# Patient Record
Sex: Male | Born: 1945 | Race: Black or African American | Hispanic: No | Smoking: Former smoker
Health system: Southern US, Community
[De-identification: ages and names within clinical notes are randomized; demographics above are authoritative.]

## PROBLEM LIST (undated history)

## (undated) DIAGNOSIS — R197 Diarrhea, unspecified: Secondary | ICD-10-CM

## (undated) DIAGNOSIS — I10 Essential (primary) hypertension: Secondary | ICD-10-CM

## (undated) DIAGNOSIS — C801 Malignant (primary) neoplasm, unspecified: Secondary | ICD-10-CM

## (undated) DIAGNOSIS — N400 Enlarged prostate without lower urinary tract symptoms: Secondary | ICD-10-CM

## (undated) DIAGNOSIS — Z9889 Other specified postprocedural states: Secondary | ICD-10-CM

## (undated) DIAGNOSIS — K589 Irritable bowel syndrome without diarrhea: Secondary | ICD-10-CM

## (undated) DIAGNOSIS — I341 Nonrheumatic mitral (valve) prolapse: Secondary | ICD-10-CM

## (undated) DIAGNOSIS — R112 Nausea with vomiting, unspecified: Secondary | ICD-10-CM

## (undated) HISTORY — DX: Irritable bowel syndrome, unspecified: K58.9

## (undated) HISTORY — PX: SHOULDER ARTHROSCOPY: SHX128

## (undated) HISTORY — PX: OTHER SURGICAL HISTORY: SHX169

---

## 1997-07-14 ENCOUNTER — Emergency Department (HOSPITAL_COMMUNITY): Admission: EM | Admit: 1997-07-14 | Discharge: 1997-07-14 | Payer: Self-pay | Admitting: Emergency Medicine

## 1997-09-29 ENCOUNTER — Emergency Department (HOSPITAL_COMMUNITY): Admission: EM | Admit: 1997-09-29 | Discharge: 1997-09-29 | Payer: Self-pay | Admitting: Emergency Medicine

## 2001-01-23 ENCOUNTER — Encounter: Payer: Self-pay | Admitting: Emergency Medicine

## 2001-01-23 ENCOUNTER — Encounter: Admission: RE | Admit: 2001-01-23 | Discharge: 2001-01-23 | Payer: Self-pay | Admitting: Emergency Medicine

## 2001-03-17 ENCOUNTER — Ambulatory Visit (HOSPITAL_COMMUNITY): Admission: RE | Admit: 2001-03-17 | Discharge: 2001-03-17 | Payer: Self-pay | Admitting: Gastroenterology

## 2004-06-01 ENCOUNTER — Encounter: Admission: RE | Admit: 2004-06-01 | Discharge: 2004-06-01 | Payer: Self-pay | Admitting: Emergency Medicine

## 2012-02-24 ENCOUNTER — Other Ambulatory Visit: Payer: Self-pay | Admitting: Gastroenterology

## 2012-02-24 DIAGNOSIS — R109 Unspecified abdominal pain: Secondary | ICD-10-CM

## 2012-02-24 DIAGNOSIS — R197 Diarrhea, unspecified: Secondary | ICD-10-CM

## 2012-02-25 ENCOUNTER — Ambulatory Visit
Admission: RE | Admit: 2012-02-25 | Discharge: 2012-02-25 | Disposition: A | Discharge status: Home / Self Care | Payer: 59 | Source: Ambulatory Visit | Attending: Gastroenterology | Admitting: Gastroenterology

## 2012-02-25 DIAGNOSIS — R197 Diarrhea, unspecified: Secondary | ICD-10-CM

## 2012-02-25 DIAGNOSIS — R109 Unspecified abdominal pain: Secondary | ICD-10-CM

## 2012-02-25 MED ORDER — IOHEXOL 300 MG/ML  SOLN
100.0000 mL | Freq: Once | INTRAMUSCULAR | Status: AC | PRN
  Administered 2012-02-25: 100 mL via INTRAVENOUS

## 2013-01-18 ENCOUNTER — Ambulatory Visit (HOSPITAL_COMMUNITY): Payer: Medicare Other | Attending: Cardiology

## 2013-01-18 DIAGNOSIS — Z136 Encounter for screening for cardiovascular disorders: Secondary | ICD-10-CM

## 2013-01-18 DIAGNOSIS — Z0000 Encounter for general adult medical examination without abnormal findings: Secondary | ICD-10-CM

## 2013-01-18 DIAGNOSIS — I10 Essential (primary) hypertension: Secondary | ICD-10-CM | POA: Insufficient documentation

## 2013-01-18 DIAGNOSIS — Z1389 Encounter for screening for other disorder: Secondary | ICD-10-CM | POA: Insufficient documentation

## 2013-01-18 DIAGNOSIS — Z87891 Personal history of nicotine dependence: Secondary | ICD-10-CM | POA: Insufficient documentation

## 2014-07-02 ENCOUNTER — Encounter (HOSPITAL_COMMUNITY): Payer: Self-pay | Admitting: Emergency Medicine

## 2014-07-02 ENCOUNTER — Observation Stay (HOSPITAL_COMMUNITY)
Admission: EM | Admit: 2014-07-02 | Discharge: 2014-07-03 | Disposition: A | Discharge status: Home / Self Care | Payer: Medicare Other | Attending: Family Medicine | Admitting: Family Medicine

## 2014-07-02 ENCOUNTER — Emergency Department (HOSPITAL_COMMUNITY): Payer: Medicare Other

## 2014-07-02 DIAGNOSIS — Z880 Allergy status to penicillin: Secondary | ICD-10-CM | POA: Insufficient documentation

## 2014-07-02 DIAGNOSIS — R079 Chest pain, unspecified: Secondary | ICD-10-CM

## 2014-07-02 DIAGNOSIS — R0789 Other chest pain: Secondary | ICD-10-CM | POA: Diagnosis not present

## 2014-07-02 DIAGNOSIS — I10 Essential (primary) hypertension: Secondary | ICD-10-CM | POA: Diagnosis not present

## 2014-07-02 HISTORY — DX: Nonrheumatic mitral (valve) prolapse: I34.1

## 2014-07-02 HISTORY — DX: Essential (primary) hypertension: I10

## 2014-07-02 LAB — BASIC METABOLIC PANEL
ANION GAP: 4 — AB (ref 5–15)
BUN: 11 mg/dL (ref 6–23)
CHLORIDE: 106 mmol/L (ref 96–112)
CO2: 28 mmol/L (ref 19–32)
Calcium: 9.7 mg/dL (ref 8.4–10.5)
Creatinine, Ser: 1.18 mg/dL (ref 0.50–1.35)
GFR calc Af Amer: 72 mL/min — ABNORMAL LOW (ref >90)
GFR calc non Af Amer: 63 mL/min — ABNORMAL LOW (ref >90)
Glucose, Bld: 86 mg/dL (ref 70–99)
POTASSIUM: 4.9 mmol/L (ref 3.5–5.1)
SODIUM: 138 mmol/L (ref 135–145)

## 2014-07-02 LAB — CBC WITH DIFFERENTIAL/PLATELET
Basophils Absolute: 0.1 10*3/uL (ref 0.0–0.1)
Basophils Relative: 3 % — ABNORMAL HIGH (ref 0–1)
Eosinophils Absolute: 0 10*3/uL (ref 0.0–0.7)
Eosinophils Relative: 1 % (ref 0–5)
HCT: 44.6 % (ref 39.0–52.0)
HEMOGLOBIN: 14.9 g/dL (ref 13.0–17.0)
Lymphocytes Relative: 43 % (ref 12–46)
Lymphs Abs: 1.2 10*3/uL (ref 0.7–4.0)
MCH: 29.9 pg (ref 26.0–34.0)
MCHC: 33.4 g/dL (ref 30.0–36.0)
MCV: 89.6 fL (ref 78.0–100.0)
Monocytes Absolute: 0.3 10*3/uL (ref 0.1–1.0)
Monocytes Relative: 9 % (ref 3–12)
NEUTROS ABS: 1.3 10*3/uL — AB (ref 1.7–7.7)
Neutrophils Relative %: 44 % (ref 43–77)
PLATELETS: 177 10*3/uL (ref 150–400)
RBC: 4.98 MIL/uL (ref 4.22–5.81)
RDW: 14.7 % (ref 11.5–15.5)
WBC: 2.9 10*3/uL — AB (ref 4.0–10.5)

## 2014-07-02 LAB — TROPONIN I: Troponin I: <0.03 ng/mL (ref <0.031)

## 2014-07-02 LAB — I-STAT TROPONIN, ED: TROPONIN I, POC: 0 ng/mL (ref 0.00–0.08)

## 2014-07-02 MED ORDER — ONDANSETRON HCL 4 MG/2ML IJ SOLN: 4.0000 mg | Freq: Four times a day (QID) | INTRAMUSCULAR | Status: DC | PRN

## 2014-07-02 MED ORDER — NITROGLYCERIN 0.4 MG SL SUBL
0.4000 mg | SUBLINGUAL_TABLET | SUBLINGUAL | Status: DC | PRN
  Filled 2014-07-02: qty 1

## 2014-07-02 MED ORDER — ENOXAPARIN SODIUM 40 MG/0.4ML ~~LOC~~ SOLN
40.0000 mg | SUBCUTANEOUS | Status: DC
  Administered 2014-07-02: 40 mg via SUBCUTANEOUS
  Filled 2014-07-02: qty 0.4

## 2014-07-02 MED ORDER — ASPIRIN EC 81 MG PO TBEC
81.0000 mg | DELAYED_RELEASE_TABLET | Freq: Every day | ORAL | Status: DC
  Administered 2014-07-03: 81 mg via ORAL
  Filled 2014-07-02: qty 1

## 2014-07-02 MED ORDER — ALFUZOSIN HCL ER 10 MG PO TB24
10.0000 mg | ORAL_TABLET | Freq: Every day | ORAL | Status: DC
  Administered 2014-07-03: 10 mg via ORAL
  Filled 2014-07-02 (×3): qty 1

## 2014-07-02 MED ORDER — MORPHINE SULFATE 4 MG/ML IJ SOLN: 2.0000 mg | Freq: Once | INTRAMUSCULAR | Status: DC

## 2014-07-02 MED ORDER — HYDRALAZINE HCL 20 MG/ML IJ SOLN: 10.0000 mg | Freq: Four times a day (QID) | INTRAMUSCULAR | Status: DC | PRN

## 2014-07-02 MED ORDER — ACETAMINOPHEN 325 MG PO TABS: 650.0000 mg | ORAL_TABLET | ORAL | Status: DC | PRN

## 2014-07-02 MED ORDER — MORPHINE SULFATE 2 MG/ML IJ SOLN
1.0000 mg | INTRAMUSCULAR | Status: DC | PRN
Start: 2014-07-02 — End: 2014-07-03
  Administered 2014-07-02: 1 mg via INTRAVENOUS
  Filled 2014-07-02: qty 1

## 2014-07-02 NOTE — ED Provider Notes (Signed)
CSN: [id number]     Arrival date & time 07/02/14  1447 History   First MD Initiated Contact with Patient 07/02/14 1501     Chief Complaint  Patient presents with  . Chest Pain     (Consider location/radiation/quality/duration/timing/severity/associated sxs/prior Treatment) HPI Christopher Duran is a 69 y.o. male with hx of htn, prsents to ED with complaint of chest pain and dizziness. Pt states chest feels "like something is squeezing it." States symptoms come and go lasting approximately 15 minutes. They're nonexertional. States he also noted his blood pressure has been elevated the last several days, was high yesterday. This morning he made an appointment with his doctor because he is concerned about this chest pain and blood pressure, he was seen and sent here for further evaluation. Upon arrival patient states the squeezing sensation has improved. He denies any associated shortness of breath or diaphoresis. He states he had a stress test but that was multiple years ago. He denies any known coronary disease. He denies any family history of heart disease. He states he has been on blood pressure medicines in the past but has been off of them for several months because "my blood pressure was under control."  Past Medical History  Diagnosis Date  . Hypertension    History reviewed. No pertinent past surgical history. No family history on file. History  Substance Use Topics  . Smoking status: Never Smoker   . Smokeless tobacco: Not on file  . Alcohol Use: No    Review of Systems  Constitutional: Negative for fever and chills.  Respiratory: Positive for chest tightness. Negative for cough and shortness of breath.   Cardiovascular: Positive for chest pain. Negative for palpitations and leg swelling.  Gastrointestinal: Negative for nausea, vomiting, abdominal pain, diarrhea and abdominal distention.  Genitourinary: Negative for dysuria, urgency, frequency and hematuria.  Musculoskeletal:  Negative for myalgias, arthralgias, neck pain and neck stiffness.  Skin: Negative for rash.  Allergic/Immunologic: Negative for immunocompromised state.  Neurological: Negative for dizziness, weakness, light-headedness, numbness and headaches.  All other systems reviewed and are negative.     Allergies  Penicillins and Tetracyclines & related  Home Medications   Prior to Admission medications   Not on File   BP 172/62 mmHg  Pulse 53  Temp(Src) 97.6 F (36.4 C) (Oral)  Resp 16  SpO2 100% Physical Exam  Constitutional: He is oriented to person, place, and time. He appears well-developed and well-nourished. No distress.  HENT:  Head: Normocephalic and atraumatic.  Eyes: Conjunctivae are normal.  Neck: Neck supple.  Cardiovascular: Normal rate, regular rhythm and normal heart sounds.   Pulmonary/Chest: Effort normal. No respiratory distress. He has no wheezes. He has no rales. He exhibits no tenderness.  Abdominal: Soft. Bowel sounds are normal. He exhibits no distension. There is no tenderness. There is no rebound.  Musculoskeletal: He exhibits no edema.  Neurological: He is alert and oriented to person, place, and time.  Skin: Skin is warm and dry.  Nursing note and vitals reviewed.   ED Course  Procedures (including critical care time) Labs Review Labs Reviewed  CBC WITH DIFFERENTIAL/PLATELET - Abnormal; Notable for the following:    WBC 2.9 (*)    Basophils Relative 3 (*)    Neutro Abs 1.3 (*)    All other components within normal limits  BASIC METABOLIC PANEL - Abnormal; Notable for the following:    GFR calc non Af Amer 63 (*)    GFR calc Af Amer 72 (*)  Anion gap 4 (*)    All other components within normal limits  TROPONIN I  TROPONIN I  TROPONIN I  BASIC METABOLIC PANEL  LIPID PANEL  CBC  I-STAT TROPOININ, ED    Imaging Review Dg Chest 2 View  07/02/2014   CLINICAL DATA:  Chest pain since last night  EXAM: CHEST  2 VIEW  COMPARISON:  None.   FINDINGS: The heart size and mediastinal contours are within normal limits. Both lungs are clear. The visualized skeletal structures are unremarkable.  IMPRESSION: No active cardiopulmonary disease.   Electronically Signed   By: Kathreen Devoid   On: 07/02/2014 16:04    <ECG>  ED ECG REPORT   Date: 07/02/2014  Rate: 53  Rhythm: sinus bradycardia  QRS Axis: normal  Intervals: normal  ST/T Wave abnormalities: nonspecific ST changes  Conduction Disutrbances:none  Narrative Interpretation:   Old EKG Reviewed: unchanged  I have personally reviewed the EKG tracing and agree with the computerized printout as noted.  MDM   Final diagnoses:  Chest pain, unspecified chest pain type    Pt with chest pressure/squeezing. Intermittent since yesterday. Hypertensive today. No on any BP meds at home. Sent here from PCP's office. Received 325mg  asa. Pt currently cp free. Will get labs, CXR, monitor.    4:56 PM Pt is heart score 4: age, 1-2 risk factors, nonspecific ecg changes, his work up is otherwise unremarkable. Will need admission for further ro. Spoke with [facility], they will admit. i reassessed pt. He has been symptom free entire time here, until now. States now having some squeezing sensation again. Will try sl nitro.    Filed Vitals:   07/02/14 1448 07/02/14 1456 07/02/14 1545  BP:  172/62 174/70  Pulse:  53 49  Temp:  97.6 F (36.4 C)   TempSrc:  Oral   Resp:  16 12  SpO2: 99% 100% 100%       Jeannett Senior, PA-C 07/02/14 1704  Leonard Schwartz, MD 07/07/14 [phone]

## 2014-07-02 NOTE — H&P (Signed)
[facility] History and Physical  Meiko Stranahan IOX:[id number] DOB: 1945-11-05 DOA: 07/02/2014  Referring physician: er PCP: Shirline Frees, MD   Chief Complaint:chest pain  HPI: Christopher Duran is a 69 y.o. male  With h/o HTN- has not been taking BP Meds for 8 months.  He for the last 1 week has been having issues with high blood pressure.  Last PM developed chest squeezing, palpitations, tingling in both hands to finger tips.  He went to PCP for evaluation and BP was found to be elevated (158/80).   PCP sent to ER. The squeezing feeling comes and goes.  No effect with eating or deep breath.  No leg pain or swelling.  No nausea or SOB  He was given ASA by his PCP and sent to ER via ambulance.  Unable to have nitro due to cialis use yesterday.  He was found to have normal labs except his WBC count.  Stress test was > 10 years ago- no work up in the mean time.   Hopsitalist were asked to observe for CP  Review of Systems:  All systems reviewed, negative unless stated above    Past Medical History  Diagnosis Date  . Hypertension   . MVP (mitral valve prolapse)    Past Surgical History  Procedure Laterality Date  . Artho      shoulder b/l   Social History:  reports that he has quit smoking. He does not have any smokeless tobacco history on file. He reports that he does not drink alcohol or use illicit drugs.  Allergies  Allergen Reactions  . Penicillins Itching and Nausea Only  . Tetracyclines & Related Itching and Nausea And Vomiting    Family History  Problem Relation Age of Onset  . CVA Father   . Cancer Mother     Prior to Admission medications   Medication Sig Start Date End Date Taking? Authorizing Provider  alfuzosin (UROXATRAL) 10 MG 24 hr tablet Take 10 mg by mouth daily with breakfast.   Yes Historical Provider, MD  tadalafil (CIALIS) 5 MG tablet Take 5 mg by mouth daily.   Yes Historical Provider, MD   Physical Exam: Filed Vitals:   07/02/14 1448  07/02/14 1456 07/02/14 1545 07/02/14 1715  BP:  172/62 174/70 161/77  Pulse:  53 49 58  Temp:  97.6 F (36.4 C)    TempSrc:  Oral    Resp:  16 12 12   SpO2: 99% 100% 100% 100%    Wt Readings from Last 3 Encounters:  No data found for Wt    General:  Appears calm and comfortable Eyes: PERRL, normal lids, irises & conjunctiva ENT: grossly normal hearing, lips & tongue Neck: no LAD, masses or thyromegaly Cardiovascular: RRR, no m/r/g. No LE edema. Telemetry: SR, no arrhythmias  Respiratory: CTA bilaterally, no w/r/r. Normal respiratory effort. Abdomen: soft, ntnd Skin: no rash or induration seen on limited exam Musculoskeletal: grossly normal tone BUE/BLE Psychiatric: grossly normal mood and affect, speech fluent and appropriate Neurologic: grossly non-focal.          Labs on Admission:  Basic Metabolic Panel:  Recent Labs Lab 07/02/14 1543  NA 138  K 4.9  CL 106  CO2 28  GLUCOSE 86  BUN 11  CREATININE 1.18  CALCIUM 9.7   Liver Function Tests: No results for input(s): AST, ALT, ALKPHOS, BILITOT, PROT, ALBUMIN in the last 168 hours. No results for input(s): LIPASE, AMYLASE in the last 168 hours. No results for input(s): AMMONIA  in the last 168 hours. CBC:  Recent Labs Lab 07/02/14 1543  WBC 2.9*  NEUTROABS 1.3*  HGB 14.9  HCT 44.6  MCV 89.6  PLT 177   Cardiac Enzymes: No results for input(s): CKTOTAL, CKMB, CKMBINDEX, TROPONINI in the last 168 hours.  BNP (last 3 results) No results for input(s): BNP in the last 8760 hours.  ProBNP (last 3 results) No results for input(s): PROBNP in the last 8760 hours.  CBG: No results for input(s): GLUCAP in the last 168 hours.  Radiological Exams on Admission: Dg Chest 2 View  07/02/2014   CLINICAL DATA:  Chest pain since last night  EXAM: CHEST  2 VIEW  COMPARISON:  None.  FINDINGS: The heart size and mediastinal contours are within normal limits. Both lungs are clear. The visualized skeletal structures are  unremarkable.  IMPRESSION: No active cardiopulmonary disease.   Electronically Signed   By: Kathreen Devoid   On: 07/02/2014 16:04    EKG: Independently reviewed. Sinus brady with T wave inversion III  Assessment/Plan Active Problems:   Chest pain   HTN (hypertension)   Chest pain- cycle CE, tele, obs, ASA, unable to have nitro as on cialis -PRN morphine  HTN- prn hydralazine  Sinus brady- watch on tele, echo  Low WBC- check in AM   Code Status: full DVT Prophylaxis: Family Communication: patient Disposition Plan:   Time spent: 4 min  Eulogio Bear [facility] Pager [phone]

## 2014-07-02 NOTE — ED Notes (Signed)
Patient states he had Chest Pain since 9 pm last night. Patient states Chest Pain intermitted. Patient give pain 4/10. Patient received 324 ASA

## 2014-07-03 ENCOUNTER — Observation Stay (HOSPITAL_COMMUNITY): Payer: Medicare Other

## 2014-07-03 DIAGNOSIS — I209 Angina pectoris, unspecified: Secondary | ICD-10-CM | POA: Diagnosis not present

## 2014-07-03 DIAGNOSIS — I10 Essential (primary) hypertension: Secondary | ICD-10-CM

## 2014-07-03 DIAGNOSIS — R0789 Other chest pain: Secondary | ICD-10-CM | POA: Diagnosis not present

## 2014-07-03 DIAGNOSIS — M889 Osteitis deformans of unspecified bone: Secondary | ICD-10-CM

## 2014-07-03 DIAGNOSIS — D72819 Decreased white blood cell count, unspecified: Secondary | ICD-10-CM

## 2014-07-03 DIAGNOSIS — F5221 Male erectile disorder: Secondary | ICD-10-CM | POA: Diagnosis not present

## 2014-07-03 DIAGNOSIS — Z880 Allergy status to penicillin: Secondary | ICD-10-CM | POA: Diagnosis not present

## 2014-07-03 DIAGNOSIS — R079 Chest pain, unspecified: Secondary | ICD-10-CM

## 2014-07-03 LAB — CBC
HEMATOCRIT: 42.3 % (ref 39.0–52.0)
HEMOGLOBIN: 13.8 g/dL (ref 13.0–17.0)
MCH: 29.7 pg (ref 26.0–34.0)
MCHC: 32.6 g/dL (ref 30.0–36.0)
MCV: 91 fL (ref 78.0–100.0)
Platelets: 180 10*3/uL (ref 150–400)
RBC: 4.65 MIL/uL (ref 4.22–5.81)
RDW: 14.9 % (ref 11.5–15.5)
WBC: 2.8 10*3/uL — AB (ref 4.0–10.5)

## 2014-07-03 LAB — BASIC METABOLIC PANEL
ANION GAP: 6 (ref 5–15)
BUN: 11 mg/dL (ref 6–23)
CO2: 26 mmol/L (ref 19–32)
Calcium: 9.3 mg/dL (ref 8.4–10.5)
Chloride: 107 mmol/L (ref 96–112)
Creatinine, Ser: 1.21 mg/dL (ref 0.50–1.35)
GFR calc Af Amer: 70 mL/min — ABNORMAL LOW (ref >90)
GFR calc non Af Amer: 61 mL/min — ABNORMAL LOW (ref >90)
GLUCOSE: 83 mg/dL (ref 70–99)
Potassium: 4.6 mmol/L (ref 3.5–5.1)
SODIUM: 139 mmol/L (ref 135–145)

## 2014-07-03 LAB — TROPONIN I
Troponin I: <0.03 ng/mL (ref <0.031)
Troponin I: <0.03 ng/mL (ref <0.031)

## 2014-07-03 LAB — LIPID PANEL
CHOL/HDL RATIO: 2.4 ratio
CHOLESTEROL: 149 mg/dL (ref 0–200)
HDL: 62 mg/dL (ref >39)
LDL Cholesterol: 80 mg/dL (ref 0–99)
Triglycerides: 36 mg/dL (ref <150)
VLDL: 7 mg/dL (ref 0–40)

## 2014-07-03 MED ORDER — RAMIPRIL 2.5 MG PO CAPS
2.5000 mg | ORAL_CAPSULE | Freq: Every day | ORAL | Status: DC
  Administered 2014-07-03: 2.5 mg via ORAL
  Filled 2014-07-03: qty 1

## 2014-07-03 MED ORDER — ASPIRIN 81 MG PO TBEC: 81.0000 mg | DELAYED_RELEASE_TABLET | Freq: Every day | ORAL | Status: DC

## 2014-07-03 MED ORDER — TECHNETIUM TC 99M SESTAMIBI GENERIC - CARDIOLITE
30.0000 | Freq: Once | INTRAVENOUS | Status: AC | PRN
  Administered 2014-07-03: 30 via INTRAVENOUS

## 2014-07-03 MED ORDER — ASPIRIN 81 MG PO CHEW: 81.0000 mg | CHEWABLE_TABLET | Freq: Every day | ORAL | Status: DC

## 2014-07-03 MED ORDER — TECHNETIUM TC 99M SESTAMIBI GENERIC - CARDIOLITE
10.0000 | Freq: Once | INTRAVENOUS | Status: AC | PRN
  Administered 2014-07-03: 10 via INTRAVENOUS

## 2014-07-03 MED ORDER — RAMIPRIL 2.5 MG PO CAPS: 2.5000 mg | ORAL_CAPSULE | Freq: Every day | ORAL | Status: DC

## 2014-07-03 NOTE — Progress Notes (Signed)
UR completed 

## 2014-07-03 NOTE — Discharge Summary (Signed)
Physician Discharge Summary  Christopher Duran JSE:[id number] DOB: February 23, 1946 DOA: 07/02/2014  PCP: Shirline Frees, MD  Admit date: 07/02/2014 Discharge date: 07/03/2014  Time spent: 45 minutes  Recommendations for Outpatient Follow-up:  1. Please follow-up echocardiogram that is pending at time of discharge 2. Consider workup for depressed white count as an outpatient-patient has history of Paget's disease 3. Added aspirin to medications on this hospital admission as well as ramipril 4. All other medications stay the same 5. Please repeat CBC plus differential in about one week  Discharge Diagnoses:  Active Problems:   Chest pain   HTN (hypertension)   Discharge Condition: Fair  Diet recommendation: Heart healthy low-salt  Filed Weights   07/02/14 1800 07/03/14 0634  Weight: 90.22 kg (198 lb 14.4 oz) 89.948 kg (198 lb 4.8 oz)    History of present illness:  Pleasant 69 year old retired male, history HTN noncompliant on that, Paget's disease, admitted 07/02/14 from PCP office with 24-36 hour history chest pain.  He was laying down after supper and experienced chest pain in center of his chest with by lateral arm radiation. He is not had this intensity of pain before but has stopped walking over the past 1-2 months because of being busy with his house and his grandchildren He used to walk a lot more before this He was symptomatic in the past from a mitral valve prolapse and has had discomfort in his chest before-he's not clear where that came from In either event about 2 weeks ago while he was doing yard work and digging a trench and moving rocks room he experienced chest pain and then in addition He was admitted to the hospital as an STATUS patient, Myoview stress test was performed because of sinus bradycardia with T-wave inversion in lead 3, he was ruled out by troponins. He was encouraged on discharge to continue aspirin 81 mg daily It was noted he had a low WBC during hospital  admission which he says is chronic on his yearly physicals as he has Paget's disease nonetheless he will probably need workup of this event his age and risk for myelodysplasia. The patient was felt to have met clinical and metabolic parameters for safe discharge on 3/30/126 and will be discharged home He will need to be instructed about use of Cialis--1 supposition is that he is preload dependent and Cialis may cause vena dilatation and pooling of blood and therefore decreased preload to his heart causing chest pain like symptoms.   Discharge Exam: Filed Vitals:   07/03/14 1140  BP: 175/55  Pulse:   Temp:   Resp:     General: EOMI NCAT  Cardiovascular: S1-S2 no murmur rub or gallop  Respiratory: Clinically clear  Discharge Instructions    Current Discharge Medication List    START taking these medications   Details  aspirin EC 81 MG EC tablet Take 1 tablet (81 mg total) by mouth daily. Qty: 30 tablet, Refills: 0    ramipril (ALTACE) 2.5 MG capsule Take 1 capsule (2.5 mg total) by mouth daily. Qty: 30 capsule, Refills: 0      CONTINUE these medications which have NOT CHANGED   Details  alfuzosin (UROXATRAL) 10 MG 24 hr tablet Take 10 mg by mouth daily with breakfast.    tadalafil (CIALIS) 5 MG tablet Take 5 mg by mouth daily.       Allergies  Allergen Reactions  . Penicillins Itching and Nausea Only  . Tetracyclines & Related Itching and Nausea And Vomiting  The results of significant diagnostics from this hospitalization (including imaging, microbiology, ancillary and laboratory) are listed below for reference.    Significant Diagnostic Studies: Dg Chest 2 View  07/02/2014   CLINICAL DATA:  Chest pain since last night  EXAM: CHEST  2 VIEW  COMPARISON:  None.  FINDINGS: The heart size and mediastinal contours are within normal limits. Both lungs are clear. The visualized skeletal structures are unremarkable.  IMPRESSION: No active cardiopulmonary disease.    Electronically Signed   By: Kathreen Devoid   On: 07/02/2014 16:04   Nm Myocar Multi W/spect W/wall Motion / Ef  07/03/2014   CLINICAL DATA:  Chest pain. Hypertension. Mitral valve prolapse. Ischemic chest pain. Initial encounter.  EXAM: MYOCARDIAL IMAGING WITH SPECT (REST AND EXERCISE)  GATED LEFT VENTRICULAR WALL MOTION STUDY  LEFT VENTRICULAR EJECTION FRACTION  TECHNIQUE: Standard myocardial SPECT imaging was performed after resting intravenous injection of 10 mCi [id number]. Subsequently, exercise tolerance test was performed by the patient under the supervision of the Cardiology staff. At peak-stress, 30 mCi Tc-943mestamibi was injected intravenously and standard myocardial SPECT imaging was performed. Quantitative gated imaging was also performed to evaluate left ventricular wall motion, and estimate left ventricular ejection fraction.  COMPARISON:  07/02/2014 chest radiograph.  FINDINGS: Perfusion: No decreased activity in the left ventricle on stress imaging to suggest reversible ischemia or infarction.  Wall Motion: Normal left ventricular wall motion. No left ventricular dilation.  Left Ventricular Ejection Fraction: 55 %  End diastolic volume 80 mL  End systolic volume 36 ml  IMPRESSION: 1. No reversible ischemia or infarction.  2. Normal left ventricular wall motion.  3. Left ventricular ejection fraction 55%  4. Low-risk stress test findings*.  *2012 Appropriate Use Criteria for Coronary Revascularization Focused Update: J Am Coll Cardiol. 203474;25(9):563-875http://content.onairportbarriers.comspx?articleid=1201161   Electronically Signed   By: GeDereck Ligas.D.   On: 07/03/2014 13:04    Microbiology: No results found for this or any previous visit (from the past 240 hour(s)).   Labs: Basic Metabolic Panel:  Recent Labs Lab 07/02/14 1543 07/03/14 0430  NA 138 139  K 4.9 4.6  CL 106 107  CO2 28 26  GLUCOSE 86 83  BUN 11 11  CREATININE 1.18 1.21  CALCIUM 9.7 9.3   Liver  Function Tests: No results for input(s): AST, ALT, ALKPHOS, BILITOT, PROT, ALBUMIN in the last 168 hours. No results for input(s): LIPASE, AMYLASE in the last 168 hours. No results for input(s): AMMONIA in the last 168 hours. CBC:  Recent Labs Lab 07/02/14 1543 07/03/14 0430  WBC 2.9* 2.8*  NEUTROABS 1.3*  --   HGB 14.9 13.8  HCT 44.6 42.3  MCV 89.6 91.0  PLT 177 180   Cardiac Enzymes:  Recent Labs Lab 07/02/14 1520 07/02/14 2240 07/03/14 0430  TROPONINI <0.03 <0.03 <0.03   BNP: BNP (last 3 results) No results for input(s): BNP in the last 8760 hours.  ProBNP (last 3 results) No results for input(s): PROBNP in the last 8760 hours.  CBG: No results for input(s): GLUCAP in the last 168 hours.     Signed: Nita Sells[facility] 07/03/2014, 3:37 PM

## 2014-07-03 NOTE — Progress Notes (Signed)
Pt tolerated GXT Myoview well. Images pending.  Kerin Ransom PA-C 07/03/2014 11:39 AM

## 2014-07-03 NOTE — Progress Notes (Signed)
*  PRELIMINARY RESULTS* Echocardiogram 2D Echocardiogram has been performed.  Christopher Duran 07/03/2014, 3:30 PM

## 2014-07-03 NOTE — Consult Note (Signed)
CARDIOLOGY CONSULT NOTE  Patient ID: Christopher Duran MRN: [id number] DOB/AGE: [birth year] 69 y.o.  Admit date: 07/02/2014 Primary Cardiologist: New Reason for Consultation: Chest pain  HPI: 69 yo with history of HTN and mitral valve prolapse presented to ER with chest pain.  Patient had a stress test about 15 years ago that was normal but has had no workup since that time.  He had been on ramipril for HTN but stopped it to see if he could control his BP without it.  Over the weekend, he noted SBP 150s-160s so set up an appointment Tuesday to see his PCP about getting back on ramipril.  Monday night, he developed squeezing tightness in his chest that would last for about a minute then resolve for 2-3 minutes then come back.  This pattern has persisted since that time.  He has had the tightness in his chest this morning.  He has had a tingling sensation down his arms into his fingers.  No dyspnea.  Symptoms are not associated with exertion or meals.  He saw PCP yesterday and was told to go to the ER.  Cardiac enzymes negative, ECG unremarkable.  No abnormalities on telemetry.    Review of systems complete and found to be negative unless listed above in HPI  Past Medical History: 1. HTN 2. Mitral valve prolapse  Family History  Problem Relation Age of Onset  . CVA Father   . Cancer Mother     History   Social History  . Marital Status: Divorced    Spouse Name: N/A  . Number of Children: N/A  . Years of Education: N/A   Occupational History  . Not on file.   Social History Main Topics  . Smoking status: Former Research scientist (life sciences)  . Smokeless tobacco: Not on file     Comment: 4 years only  . Alcohol Use: No  . Drug Use: No  . Sexual Activity: Not on file   Other Topics Concern  . Not on file   Social History Narrative     Prescriptions prior to admission  Medication Sig Dispense Refill Last Dose  . alfuzosin (UROXATRAL) 10 MG 24 hr tablet Take 10 mg by mouth daily with breakfast.    07/02/2014 at Unknown time  . tadalafil (CIALIS) 5 MG tablet Take 5 mg by mouth daily.   07/01/2014 at Unknown time    Physical exam Blood pressure 138/63, pulse 83, temperature 98.3 F (36.8 C), temperature source Oral, resp. rate 18, height 5\' 11"  (1.803 m), weight 198 lb 4.8 oz (89.948 kg), SpO2 97 %. General: NAD Neck: No JVD, no thyromegaly or thyroid nodule.  Lungs: Clear to auscultation bilaterally with normal respiratory effort. CV: Nondisplaced PMI.  Heart regular S1/S2, no S3/S4, no murmur.  No peripheral edema.  No carotid bruit.  Normal pedal pulses.  Abdomen: Soft, nontender, no hepatosplenomegaly, no distention.  Skin: Intact without lesions or rashes.  Neurologic: Alert and oriented x 3.  Psych: Normal affect. Extremities: No clubbing or cyanosis.  HEENT: Normal.   Labs:   Lab Results  Component Value Date   WBC 2.8* 07/03/2014   HGB 13.8 07/03/2014   HCT 42.3 07/03/2014   MCV 91.0 07/03/2014   PLT 180 07/03/2014    Recent Labs Lab 07/03/14 0430  NA 139  K 4.6  CL 107  CO2 26  BUN 11  CREATININE 1.21  CALCIUM 9.3  GLUCOSE 83   Lab Results  Component Value Date   TROPONINI <  0.03 07/03/2014     Radiology: - CXR: No active disease.  EKG: NSR, normal  ASSESSMENT AND PLAN: 69 yo with history of HTN presented with chest pain.   1. Chest pain: Ongoing episodes occurring at rest.  Negative troponin and unremarkable ECG.  Main risk factors are age/gender and HTN.  He does not smoke and has no FH of CAD that he knows of.  Given ongoing episodes, must be some concern for unstable angina so will arrange for inpatient Cardiolite stress test.  He can exercise.  Needs to start ASA 81 daily.  2. HTN: Will restart ramipril 2.5 mg daily.   Loralie Champagne 07/03/2014 8:17 AM

## 2014-07-15 ENCOUNTER — Encounter: Payer: Medicare Other | Admitting: Physician Assistant

## 2018-02-08 ENCOUNTER — Emergency Department (HOSPITAL_COMMUNITY): Payer: Medicare Other

## 2018-02-08 ENCOUNTER — Encounter (HOSPITAL_COMMUNITY): Payer: Self-pay | Admitting: Emergency Medicine

## 2018-02-08 ENCOUNTER — Observation Stay (HOSPITAL_COMMUNITY)
Admission: EM | Admit: 2018-02-08 | Discharge: 2018-02-09 | Disposition: A | Discharge status: Home / Self Care | Payer: Medicare Other | Attending: Internal Medicine | Admitting: Internal Medicine

## 2018-02-08 ENCOUNTER — Other Ambulatory Visit: Payer: Self-pay

## 2018-02-08 DIAGNOSIS — Z880 Allergy status to penicillin: Secondary | ICD-10-CM | POA: Insufficient documentation

## 2018-02-08 DIAGNOSIS — Z79899 Other long term (current) drug therapy: Secondary | ICD-10-CM | POA: Diagnosis not present

## 2018-02-08 DIAGNOSIS — D649 Anemia, unspecified: Secondary | ICD-10-CM | POA: Diagnosis not present

## 2018-02-08 DIAGNOSIS — R001 Bradycardia, unspecified: Secondary | ICD-10-CM | POA: Insufficient documentation

## 2018-02-08 DIAGNOSIS — Z7982 Long term (current) use of aspirin: Secondary | ICD-10-CM | POA: Insufficient documentation

## 2018-02-08 DIAGNOSIS — D509 Iron deficiency anemia, unspecified: Secondary | ICD-10-CM | POA: Diagnosis not present

## 2018-02-08 DIAGNOSIS — I10 Essential (primary) hypertension: Secondary | ICD-10-CM | POA: Insufficient documentation

## 2018-02-08 DIAGNOSIS — I341 Nonrheumatic mitral (valve) prolapse: Secondary | ICD-10-CM | POA: Insufficient documentation

## 2018-02-08 DIAGNOSIS — Z87891 Personal history of nicotine dependence: Secondary | ICD-10-CM | POA: Diagnosis not present

## 2018-02-08 DIAGNOSIS — R0789 Other chest pain: Secondary | ICD-10-CM | POA: Diagnosis not present

## 2018-02-08 DIAGNOSIS — R0600 Dyspnea, unspecified: Secondary | ICD-10-CM | POA: Diagnosis not present

## 2018-02-08 DIAGNOSIS — R42 Dizziness and giddiness: Secondary | ICD-10-CM | POA: Diagnosis not present

## 2018-02-08 DIAGNOSIS — K625 Hemorrhage of anus and rectum: Secondary | ICD-10-CM | POA: Diagnosis not present

## 2018-02-08 DIAGNOSIS — N400 Enlarged prostate without lower urinary tract symptoms: Secondary | ICD-10-CM | POA: Diagnosis not present

## 2018-02-08 DIAGNOSIS — E538 Deficiency of other specified B group vitamins: Secondary | ICD-10-CM | POA: Diagnosis not present

## 2018-02-08 HISTORY — DX: Benign prostatic hyperplasia without lower urinary tract symptoms: N40.0

## 2018-02-08 HISTORY — DX: Diarrhea, unspecified: R19.7

## 2018-02-08 LAB — CBC WITH DIFFERENTIAL/PLATELET
Abs Immature Granulocytes: 0 10*3/uL (ref 0.00–0.07)
BASOS PCT: 2 %
Basophils Absolute: 0.1 10*3/uL (ref 0.0–0.1)
EOS ABS: 0 10*3/uL (ref 0.0–0.5)
Eosinophils Relative: 1 %
HCT: 31.2 % — ABNORMAL LOW (ref 39.0–52.0)
Hemoglobin: 8.5 g/dL — ABNORMAL LOW (ref 13.0–17.0)
IMMATURE GRANULOCYTES: 0 %
Lymphocytes Relative: 34 %
Lymphs Abs: 1.3 10*3/uL (ref 0.7–4.0)
MCH: 19.4 pg — AB (ref 26.0–34.0)
MCHC: 27.2 g/dL — ABNORMAL LOW (ref 30.0–36.0)
MCV: 71.1 fL — AB (ref 80.0–100.0)
MONO ABS: 0.3 10*3/uL (ref 0.1–1.0)
MONOS PCT: 8 %
NEUTROS PCT: 55 %
Neutro Abs: 2.1 10*3/uL (ref 1.7–7.7)
PLATELETS: 218 10*3/uL (ref 150–400)
RBC: 4.39 MIL/uL (ref 4.22–5.81)
RDW: 20.1 % — AB (ref 11.5–15.5)
WBC: 3.8 10*3/uL — AB (ref 4.0–10.5)
nRBC: 0 % (ref 0.0–0.2)

## 2018-02-08 LAB — VITAMIN B12: VITAMIN B 12: 532 pg/mL (ref 180–914)

## 2018-02-08 LAB — BASIC METABOLIC PANEL
ANION GAP: 6 (ref 5–15)
BUN: 12 mg/dL (ref 8–23)
CALCIUM: 9.4 mg/dL (ref 8.9–10.3)
CO2: 26 mmol/L (ref 22–32)
Chloride: 106 mmol/L (ref 98–111)
Creatinine, Ser: 1.18 mg/dL (ref 0.61–1.24)
GFR calc Af Amer: >60 mL/min (ref >60)
GLUCOSE: 89 mg/dL (ref 70–99)
Potassium: 5 mmol/L (ref 3.5–5.1)
SODIUM: 138 mmol/L (ref 135–145)

## 2018-02-08 LAB — RETICULOCYTES
IMMATURE RETIC FRACT: 24.6 % — AB (ref 2.3–15.9)
RBC.: 4.32 MIL/uL (ref 4.22–5.81)
Retic Count, Absolute: 26.8 10*3/uL (ref 19.0–186.0)
Retic Ct Pct: 0.6 % (ref 0.4–3.1)

## 2018-02-08 LAB — CBG MONITORING, ED: Glucose-Capillary: 83 mg/dL (ref 70–99)

## 2018-02-08 LAB — HEPATIC FUNCTION PANEL
ALT: 10 U/L (ref 0–44)
AST: 24 U/L (ref 15–41)
Albumin: 3.8 g/dL (ref 3.5–5.0)
Alkaline Phosphatase: 64 U/L (ref 38–126)
BILIRUBIN DIRECT: 0.2 mg/dL (ref 0.0–0.2)
BILIRUBIN INDIRECT: 0.5 mg/dL (ref 0.3–0.9)
BILIRUBIN TOTAL: 0.7 mg/dL (ref 0.3–1.2)
Total Protein: 6.8 g/dL (ref 6.5–8.1)

## 2018-02-08 LAB — POC OCCULT BLOOD, ED: Fecal Occult Bld: NEGATIVE

## 2018-02-08 LAB — IRON AND TIBC
Iron: 17 ug/dL — ABNORMAL LOW (ref 45–182)
SATURATION RATIOS: 4 % — AB (ref 17.9–39.5)
TIBC: 419 ug/dL (ref 250–450)
UIBC: 402 ug/dL

## 2018-02-08 LAB — FERRITIN: FERRITIN: 4 ng/mL — AB (ref 24–336)

## 2018-02-08 LAB — PREPARE RBC (CROSSMATCH)

## 2018-02-08 LAB — ABO/RH: ABO/RH(D): O POS

## 2018-02-08 LAB — TSH: TSH: 1.023 u[IU]/mL (ref 0.350–4.500)

## 2018-02-08 LAB — LACTATE DEHYDROGENASE: LDH: 153 U/L (ref 98–192)

## 2018-02-08 MED ORDER — PANTOPRAZOLE SODIUM 40 MG PO TBEC
40.0000 mg | DELAYED_RELEASE_TABLET | Freq: Two times a day (BID) | ORAL | Status: DC
  Administered 2018-02-08 – 2018-02-09 (×2): 40 mg via ORAL
  Filled 2018-02-08 (×2): qty 1

## 2018-02-08 MED ORDER — SODIUM CHLORIDE 0.9 % IV SOLN
10.0000 mL/h | Freq: Once | INTRAVENOUS | Status: AC
  Administered 2018-02-08: 10 mL/h via INTRAVENOUS

## 2018-02-08 MED ORDER — SODIUM CHLORIDE 0.9% IV SOLUTION: Freq: Once | INTRAVENOUS | Status: DC

## 2018-02-08 MED ORDER — SODIUM CHLORIDE 0.9% FLUSH
3.0000 mL | Freq: Two times a day (BID) | INTRAVENOUS | Status: DC
  Administered 2018-02-09: 3 mL via INTRAVENOUS

## 2018-02-08 MED ORDER — ALFUZOSIN HCL ER 10 MG PO TB24
10.0000 mg | ORAL_TABLET | Freq: Every day | ORAL | Status: DC
  Filled 2018-02-08: qty 1

## 2018-02-08 MED ORDER — ASPIRIN EC 81 MG PO TBEC
81.0000 mg | DELAYED_RELEASE_TABLET | Freq: Every day | ORAL | Status: DC
  Administered 2018-02-08 – 2018-02-09 (×2): 81 mg via ORAL
  Filled 2018-02-08 (×2): qty 1

## 2018-02-08 NOTE — ED Triage Notes (Signed)
Per ems Pt is from [facility] and sent here by them due to bradycardian 69s.  He complains of dizziness x 1 week and vertigo like symptoms.  Pt is AOx4 NAD noted at this time.

## 2018-02-08 NOTE — H&P (Signed)
History and Physical   Christopher Duran GMW:[id number] DOB: Jun 03, 1945 DOA: 02/08/2018  PCP: Shirline Frees, MD  Chief Complaint: light-headedness  HPI: This is a 72 year old man with medical problems including B12 deficiency on replacement, hypertension on ACE inhibitor, BPH on alfuzosin, chronic intermittent diarrhea, who presents to the emergency department with 2 to 3 weeks of lightheadedness, fatigue, presyncope, and dyspnea on exertion.  He reports no overt blood loss seen from his GI tract or urinary stream.  He has had in the past some anal fissures and hemorrhoids that have bled, but not within the past few days.  He denies melena.  He had a colonoscopy 5 years ago per his report, does not report any pathology.  He is never had these symptoms before, no active chest pain at this time, but does report some chest tightness within the past few weeks without aggravating or alleviating factors.  He has had some nausea, but no vomiting.  He went to his local gastroenterology clinic where his heart rate was found to be in the 50s, therefore was told to report to the emergency department for further evaluation.  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  He is accompanied by his daughter Marzetta Board.  Independent in his ADLs and IADLs at baseline.  He walks roughly one time a week for 3 or 4 miles, he last engaged in this 3 to 4 weeks ago.  He has had increased ice cravings, has not eaten clay.  No history of blood transfusion.  Last B12 injections was a few weeks ago via his primary care physician.  ED Course: In the emergency department vital signs remarkable for systolic blood pressure between 1 6170, heart rate 59, other vital signs within normal limits.  Lab evaluation remarkable for hemoglobin of 8.5, microcytic with an MCV of 71.  White blood cell and platelet count normal.  BMP relatively unremarkable  with a creatinine of 1.2.  Fecal occult blood was negative.  Last hemoglobin in our system from 2016 was normal.  CT scan of the head without acute findings.  Chest x-ray without acute on normality.  Patient was ordered 1 unit of packed red blood cells by the emergency medicine team, hospital medicine consulted for further management.  EKG personally reviewed revealed sinus rhythm with a heart rate of ~60 bpm.  Review of Systems: A complete ROS was obtained; pertinent positives negatives are denoted in the HPI. Otherwise, all systems are negative.   Past Medical History:  Diagnosis Date  . Hypertension   . MVP (mitral valve prolapse)    Social History   Socioeconomic History  . Marital status: Divorced    Spouse name: Not on file  . Number of children: Not on file  . Years of education: Not on file  . Highest education level: Not on file  Occupational History  . Not on file  Social Needs  . Financial resource strain: Not on file  . Food insecurity:    Worry: Not on file    Inability: Not on file  . Transportation needs:    Medical: Not on file    Non-medical: Not on file  Tobacco Use  . Smoking status: Former Research scientist (life sciences)  . Tobacco comment: 4 years only  Substance and Sexual Activity  . Alcohol use: No    Alcohol/week: 0.0 standard drinks  . Drug use: No  . Sexual activity: Not on  file  Lifestyle  . Physical activity:    Days per week: Not on file    Minutes per session: Not on file  . Stress: Not on file  Relationships  . Social connections:    Talks on phone: Not on file    Gets together: Not on file    Attends religious service: Not on file    Active member of club or organization: Not on file    Attends meetings of clubs or organizations: Not on file    Relationship status: Not on file  . Intimate partner violence:    Fear of current or ex partner: Not on file    Emotionally abused: Not on file    Physically abused: Not on file    Forced sexual activity: Not on file   Other Topics Concern  . Not on file  Social History Narrative  . Not on file   Family History  Problem Relation Age of Onset  . CVA Father   . Cancer Mother    His father died at age 94 from a stroke, mother died at age 56 from lung cancer.  Physical Exam: Vitals:   02/08/18 1400 02/08/18 1415 02/08/18 1845 02/08/18 1915  BP: (!) 177/67 (!) 170/65 (!) 165/74 (!) 172/76  Pulse: (!) 56 (!) 58 (!) 58 63  Resp: 12 15 19 16   Temp:      TempSrc:      SpO2: 100% 100% 100% 100%  Weight:      Height:       General: Appears tired, pleasant and cooperative black man, no acute distress. ENT: Grossly normal hearing, MMM. No scleral icterus, sub-conjunctival pallor noted. Cardiovascular: Heart sounds distant. RRR. No M/R/G. No LE edema.  Respiratory: CTA bilaterally. No wheezes or crackles. Normal respiratory effort. Abdomen: Soft, non-tender. Bowel sounds present.  Skin: No rash or induration seen on limited exam. Musculoskeletal: Grossly normal tone BUE/BLE. Appropriate ROM.  Psychiatric: Grossly normal mood and affect. Neurologic: Moves all extremities in coordinated fashion.  I have personally reviewed the following labs, culture data, and imaging studies.  Assessment/Plan:  #Symptomatic anemia, acuity uncertain Course: presenting symptoms of multi-week worsening of symptoms including: light-headedness, fatigue, DOE, pre-syncope.  No overt bleeding from GI/GU tract, fecal occult negative. On admission hb of 8.5, MCV of 71, RDW increased.  Ordered 1 unit of PRBCs by emergency medicine team. A/P: Clinical picture suggestive of symptomatic anemia, likely subacute in developing, with microcytosis most suggestive of iron deficiency. However, also important to consider other or co-contributing causes. For example, would expect a compensatory increase in HR which he does not have (may be suggestive cardiac conduction problem, although no AV nodal blocks evident on admission EKG).  Since he  is symptomatic, transfusion of PRBCs as ordered by emergency medicine team is reasonable (I asked to reduce the amount from 2 units to 1 unit of PRBCs) and assess symptoms thereafter. Peripheral blood work up to further characterize cause of anemia ordered: reticulocytes, iron studies, hepatic function panel, LDH, vitamin B12.  Keep NPO after midnight in event inpatient GI consultation / endoscopic examination is feasible / recommended (although suspect if clinical stability persists - outpatient is reasonable).  Starting on PPI BID empirically.  Monitor on telemetry to rule out AV nodal conduction problem.  #Other problems: -HTN: hold ACEi for now, mild HTN tolerable at this time -BPH: continue alpha blocker  -B12 deficiency, hx of : checking B12 level -Intermittent chronic diarrhea: not active at this time, monitor  DVT prophylaxis: SCD given possibility of occult GI bleeding Code Status: Full code Disposition Plan: Anticipate D/C home as early as 02/09/2018 pending continued clinical stability Consults called:  None, consider GI in AM Admission status: admit to hospital medicine service   Cheri Rous, MD [facility] Page:[phone]  If 7PM-7AM, please contact night-coverage [email or web address] Password [email or web address]  This document was created using the aid of voice recognition / dication software.

## 2018-02-08 NOTE — ED Provider Notes (Signed)
[place] EMERGENCY DEPARTMENT Provider Note   CSN: [id number] Arrival date & time: 02/08/18  1329     History   Chief Complaint Chief Complaint  Patient presents with  . Near Syncope  . Dizziness    HPI Christopher Duran is a 72 y.o. male.  Who presents to the emergency department with chief complaint of vertigo and near syncope.  Patient states that over the past 4 or 5 days he has had daily episodes of room spinning sensation lasting minutes at a time.  He has had this previously about 30 years ago.  He denies ataxia, nausea or vomiting, severe headache, changes in vision, unilateral weakness, difficulty with speech or swallowing, ear ringing, ear pain.  He patient feels like it is worse when he turns toward his right side.  The patient is also had several episodes of feeling like he was going to pass out with associated chest tightness and shortness of breath.  He says that this does seem to be associated with exertion it occurred 3 times when he was trying to mow his lawn.  He had onset of chest tightness and feeling as if he was going to pass out which resolved when he sat down and leaned his head toward the ground.  This also occurred when he was walking up his stairs.  Today the patient was at [facility] shopping when he had onset of the symptoms at his checkout counter.  He denies racing or skipping in his heart, palpitations or other abnormalities.  He has no history of heart murmur.  He denies a history of GI bleed, recent melena or hematochezia.  He has had no recent episodes of volume loss with diarrhea or vomiting.  He is on no new medications.  He takes ramipril for blood pressure.  He quit smoking in 1973.  He denies a history of hypercholesterolemia.  HPI  Past Medical History:  Diagnosis Date  . Hypertension   . MVP (mitral valve prolapse)     Patient Active Problem List   Diagnosis Date Noted  . Chest pain 07/02/2014  . HTN (hypertension) 07/02/2014     Past Surgical History:  Procedure Laterality Date  . artho     shoulder b/l        Home Medications    Prior to Admission medications   Medication Sig Start Date End Date Taking? Authorizing Provider  alfuzosin (UROXATRAL) 10 MG 24 hr tablet Take 10 mg by mouth daily with breakfast.    [provider]  aspirin EC 81 MG EC tablet Take 1 tablet (81 mg total) by mouth daily. 07/03/14   Nita Sells, MD  ramipril (ALTACE) 2.5 MG capsule Take 1 capsule (2.5 mg total) by mouth daily. 07/03/14   Nita Sells, MD  tadalafil (CIALIS) 5 MG tablet Take 5 mg by mouth daily.    [provider]    Family History Family History  Problem Relation Age of Onset  . CVA Father   . Cancer Mother     Social History Social History   Tobacco Use  . Smoking status: Former Research scientist (life sciences)  . Tobacco comment: 4 years only  Substance Use Topics  . Alcohol use: No    Alcohol/week: 0.0 standard drinks  . Drug use: No     Allergies   Penicillins and Tetracyclines & related   Review of Systems Review of Systems  Ten systems reviewed and are negative for acute change, except as noted in the HPI.  Physical Exam Updated Vital Signs BP (!) 170/65   Pulse (!) 58   Temp 97.7 F (36.5 C) (Oral)   Resp 15   Ht 5\' 11"  (1.803 m)   Wt 90.7 kg   SpO2 100%   BMI 27.89 kg/m   Physical Exam Physical Exam  Nursing note and vitals reviewed. Constitutional: He appears well-developed and well-nourished. No distress.  HENT:  Head: Normocephalic and atraumatic.  Eyes: Conjunctivae normal are normal. No scleral icterus.  Neck: Normal range of motion. Neck supple.  Cardiovascular: Normal rate, regular rhythm and normal heart sounds.   Pulmonary/Chest: Effort normal and breath sounds normal. No respiratory distress.  Abdominal: Soft. There is no tenderness.  Musculoskeletal: He exhibits no edema.  Neurological: He is alert. Speech is clear and goal oriented, follows  commands Major Cranial nerves without deficit, no facial droop Normal strength in upper and lower extremities bilaterally including dorsiflexion and plantar flexion, strong and equal grip strength Sensation normal to light and sharp touch Moves extremities without ataxia, coordination intact Normal finger to nose and rapid alternating movements Neg romberg, no pronator drift Normal gait Normal heel-shin and balance Skin: Skin is warm and dry. He is not diaphoretic.  Psychiatric: His behavior is normal.     ED Treatments / Results  Labs (all labs ordered are listed, but only abnormal results are displayed) Labs Reviewed  CBC WITH DIFFERENTIAL/PLATELET - Abnormal; Notable for the following components:      Result Value   WBC 3.8 (*)    Hemoglobin 8.5 (*)    HCT 31.2 (*)    MCV 71.1 (*)    MCH 19.4 (*)    MCHC 27.2 (*)    RDW 20.1 (*)    All other components within normal limits  BASIC METABOLIC PANEL  CBG MONITORING, ED  POC OCCULT BLOOD, ED  TYPE AND SCREEN    EKG EKG Interpretation  Date/Time:  Wednesday February 08 2018 13:36:10 EST Ventricular Rate:  55 PR Interval:    QRS Duration: 88 QT Interval:  412 QTC Calculation: 394 R Axis:   20 Text Interpretation:  Sinus rhythm Confirmed by Elnora Morrison [phone]) on 02/08/2018 1:41:03 PM   Radiology Dg Chest 2 View  Result Date: 02/08/2018 CLINICAL DATA:  Presyncope.  Hypotension. EXAM: CHEST - 2 VIEW COMPARISON:  Two-view chest x-ray 07/02/2014. FINDINGS: Heart size normal. The lungs are clear. The visualized soft tissues and bony thorax are unremarkable. IMPRESSION: Negative two view chest x-ray Electronically Signed   By: San Morelle M.D.   On: 02/08/2018 16:36   Ct Head Wo Contrast  Result Date: 02/08/2018 CLINICAL DATA:  Vertical bradycardia and dizzy EXAM: CT HEAD WITHOUT CONTRAST TECHNIQUE: Contiguous axial images were obtained from the base of the skull through the vertex without intravenous contrast.  COMPARISON:  None. FINDINGS: Brain: No acute territorial infarction, hemorrhage or intracranial mass. Moderate atrophy. Ventricle size nonenlarged. Vascular: No hyperdense vessels.  Carotid vascular calcification Skull: Normal. Negative for fracture or focal lesion. Sinuses/Orbits: No acute finding. Other: None IMPRESSION: 1. No CT evidence for acute intracranial abnormality. 2. Atrophy Electronically Signed   By: Donavan Foil M.D.   On: 02/08/2018 17:28    Procedures Procedures (including critical care time)  Medications Ordered in ED Medications - No data to display   Initial Impression / Assessment and Plan / ED Course  I have reviewed the triage vital signs and the nursing notes.  Pertinent labs & imaging results that were available during my care  of the patient were reviewed by me and considered in my medical decision making (see chart for details).     The differential for syncope is extensive and includes, but is not limited to: arrythmia (Vtach, SVT, SSS, sinus arrest, AV block, bradycardia) aortic stenosis, AMI, HOCM, PE, atrial myxoma, pulmonary hypertension, orthostatic hypotension, (hypovolemia, drug effect, GB syndrome, micturition, cough,) symptomatic anemia, carotid sinus sensitivity, Seizure, TIA/CVA, hypoglycemia,  Vertigo.  Patient's labs are currently pending.  His blood pressure is elevated.  I have given sign out to Dr. Gilford Raid to assume care of the patient.  He is stable here in the emergency department.   Final Clinical Impressions(s) / ED Diagnoses   Final diagnoses:  None    ED Discharge Orders    None       Margarita Mail, PA-C 02/08/18 1735    Isla Pence, MD 02/08/18 [id number]

## 2018-02-09 ENCOUNTER — Encounter (HOSPITAL_COMMUNITY): Payer: Self-pay | Admitting: Emergency Medicine

## 2018-02-09 DIAGNOSIS — D649 Anemia, unspecified: Secondary | ICD-10-CM | POA: Diagnosis not present

## 2018-02-09 LAB — CBC
HCT: 33 % — ABNORMAL LOW (ref 39.0–52.0)
HEMOGLOBIN: 9.2 g/dL — AB (ref 13.0–17.0)
MCH: 20 pg — ABNORMAL LOW (ref 26.0–34.0)
MCHC: 27.9 g/dL — ABNORMAL LOW (ref 30.0–36.0)
MCV: 71.6 fL — AB (ref 80.0–100.0)
NRBC: 0 % (ref 0.0–0.2)
Platelets: 206 10*3/uL (ref 150–400)
RBC: 4.61 MIL/uL (ref 4.22–5.81)
RDW: 21.9 % — AB (ref 11.5–15.5)
WBC: 4.2 10*3/uL (ref 4.0–10.5)

## 2018-02-09 LAB — BASIC METABOLIC PANEL
ANION GAP: 8 (ref 5–15)
BUN: 14 mg/dL (ref 8–23)
CALCIUM: 9.1 mg/dL (ref 8.9–10.3)
CO2: 22 mmol/L (ref 22–32)
Chloride: 107 mmol/L (ref 98–111)
Creatinine, Ser: 1.18 mg/dL (ref 0.61–1.24)
GFR calc non Af Amer: >60 mL/min (ref >60)
Glucose, Bld: 84 mg/dL (ref 70–99)
POTASSIUM: 4.8 mmol/L (ref 3.5–5.1)
Sodium: 137 mmol/L (ref 135–145)

## 2018-02-09 LAB — HIV ANTIBODY (ROUTINE TESTING W REFLEX): HIV Screen 4th Generation wRfx: NONREACTIVE

## 2018-02-09 MED ORDER — SODIUM CHLORIDE 0.9 % IV SOLN
510.0000 mg | Freq: Once | INTRAVENOUS | Status: AC
  Administered 2018-02-09: 510 mg via INTRAVENOUS
  Filled 2018-02-09: qty 17

## 2018-02-09 MED ORDER — ACETAMINOPHEN 325 MG PO TABS: 650.0000 mg | ORAL_TABLET | Freq: Four times a day (QID) | ORAL | Status: DC | PRN

## 2018-02-09 MED ORDER — ALFUZOSIN HCL ER 10 MG PO TB24
10.0000 mg | ORAL_TABLET | Freq: Every day | ORAL | Status: DC
  Filled 2018-02-09: qty 1

## 2018-02-09 NOTE — Progress Notes (Signed)
Patient ambulated in hall with no problems. Patients heart rate was 80 while sitting then after starting to walk went to 90 and then back to 80 while walking down the hall.

## 2018-02-09 NOTE — Discharge Summary (Signed)
Physician Discharge Summary  Rudy Luhmann UDJ:[id number] DOB: November 06, 1945 DOA: 02/08/2018  PCP: Shirline Frees, MD  Admit date: 02/08/2018 Discharge date: 02/09/2018  Admitted From: home Discharge disposition: home   Recommendations for Outpatient Follow-Up:   1. Cbc 1 week 2. GI follow up for help with hemorrhoids/rectal bleeding  3. PRN iron IV if does tolerate oral  Discharge Diagnosis:   Active Problems:   Anemia   Fatigue associated with anemia    Discharge Condition: Improved.  Diet recommendation: Low sodium, heart healthy  Wound care: None.  Code status: Full.   History of Present Illness:   This is a 72 year old man with medical problems including B12 deficiency on replacement, hypertension on ACE inhibitor, BPH on alfuzosin, chronic intermittent diarrhea, who presents to the emergency department with 2 to 3 weeks of lightheadedness, fatigue, presyncope, and dyspnea on exertion.  He reports no overt blood loss seen from his GI tract or urinary stream.  He has had in the past some anal fissures and hemorrhoids that have bled, but not within the past few days.  He denies melena.  He had a colonoscopy 5 years ago per his report, does not report any pathology.  He is never had these symptoms before, no active chest pain at this time, but does report some chest tightness within the past few weeks without aggravating or alleviating factors.  He has had some nausea, but no vomiting.  He went to his local gastroenterology clinic where his heart rate was found to be in the 50s, therefore was told to report to the emergency department for further evaluation.  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  He is accompanied by his daughter Marzetta Board.  Independent in his ADLs and IADLs at baseline.  He walks roughly one time a week for 3 or 4 miles, he last engaged in this 3 to 4  weeks ago.  He has had increased ice cravings, has not eaten clay.  No history of blood transfusion.  Last B12 injections was a few weeks ago via his primary care physician.   Hospital Course by Problem:   Anemia- iron def -heme negative -does have bouts of rectal bleeding -CBC 1 week -s/p 1 unit PRBC -s/p IV fe  Bradycardia -resolved -not on any blocking agents -not clear etiology    Medical Consultants:      Discharge Exam:   Vitals:   02/09/18 0029 02/09/18 0529  BP: (!) 147/71 (!) 141/73  Pulse: (!) 57 61  Resp: 16 18  Temp: 98.8 F (37.1 C) 98.9 F (37.2 C)  SpO2: 100% 97%   Vitals:   02/08/18 2200 02/08/18 2222 02/09/18 0029 02/09/18 0529  BP: (!) 160/66 (!) 155/61 (!) 147/71 (!) 141/73  Pulse: 63 (!) 59 (!) 57 61  Resp: 17 18 16 18   Temp: 98.4 F (36.9 C) 98.6 F (37 C) 98.8 F (37.1 C) 98.9 F (37.2 C)  TempSrc: Oral Oral Oral Oral  SpO2: 100% 100% 100% 97%  Weight:      Height:        General exam: Appears calm and comfortable.    The results of significant diagnostics from this hospitalization (including imaging, microbiology, ancillary and laboratory) are listed below for reference.     Procedures and Diagnostic Studies:   Dg Chest 2 View  Result Date: 02/08/2018 CLINICAL DATA:  Presyncope.  Hypotension.  EXAM: CHEST - 2 VIEW COMPARISON:  Two-view chest x-ray 07/02/2014. FINDINGS: Heart size normal. The lungs are clear. The visualized soft tissues and bony thorax are unremarkable. IMPRESSION: Negative two view chest x-ray Electronically Signed   By: San Morelle M.D.   On: 02/08/2018 16:36   Ct Head Wo Contrast  Result Date: 02/08/2018 CLINICAL DATA:  Vertical bradycardia and dizzy EXAM: CT HEAD WITHOUT CONTRAST TECHNIQUE: Contiguous axial images were obtained from the base of the skull through the vertex without intravenous contrast. COMPARISON:  None. FINDINGS: Brain: No acute territorial infarction, hemorrhage or intracranial  mass. Moderate atrophy. Ventricle size nonenlarged. Vascular: No hyperdense vessels.  Carotid vascular calcification Skull: Normal. Negative for fracture or focal lesion. Sinuses/Orbits: No acute finding. Other: None IMPRESSION: 1. No CT evidence for acute intracranial abnormality. 2. Atrophy Electronically Signed   By: Donavan Foil M.D.   On: 02/08/2018 17:28     Labs:   Basic Metabolic Panel: Recent Labs  Lab 02/08/18 1533 02/09/18 0501  NA 138 137  K 5.0 4.8  CL 106 107  CO2 26 22  GLUCOSE 89 84  BUN 12 14  CREATININE 1.18 1.18  CALCIUM 9.4 9.1   GFR Estimated Creatinine Clearance: 67.1 mL/min (by C-G formula based on SCr of 1.18 mg/dL). Liver Function Tests: Recent Labs  Lab 02/08/18 2038  AST 24  ALT 10  ALKPHOS 64  BILITOT 0.7  PROT 6.8  ALBUMIN 3.8   No results for input(s): LIPASE, AMYLASE in the last 168 hours. No results for input(s): AMMONIA in the last 168 hours. Coagulation profile No results for input(s): INR, PROTIME in the last 168 hours.  CBC: Recent Labs  Lab 02/08/18 1533 02/09/18 0501  WBC 3.8* 4.2  NEUTROABS 2.1  --   HGB 8.5* 9.2*  HCT 31.2* 33.0*  MCV 71.1* 71.6*  PLT 218 206   Cardiac Enzymes: No results for input(s): CKTOTAL, CKMB, CKMBINDEX, TROPONINI in the last 168 hours. BNP: Invalid input(s): POCBNP CBG: Recent Labs  Lab 02/08/18 1340  GLUCAP 83   D-Dimer No results for input(s): DDIMER in the last 72 hours. Hgb A1c No results for input(s): HGBA1C in the last 72 hours. Lipid Profile No results for input(s): CHOL, HDL, LDLCALC, TRIG, CHOLHDL, LDLDIRECT in the last 72 hours. Thyroid function studies Recent Labs    02/08/18 2038  TSH 1.023   Anemia work up Recent Labs    02/08/18 2038  VITAMINB12 532  FERRITIN 4*  TIBC 419  IRON 17*  RETICCTPCT 0.6   Microbiology No results found for this or any previous visit (from the past 240 hour(s)).   Discharge Instructions:   Discharge Instructions    Discharge  instructions   Complete by:  As directed    Cbc 1 week -need to follow up with GI for hemorrhoidal bleeding and possible intervention -trial of oral iron with PCP and if not tolerating, may need occasional IV infusion   Increase activity slowly   Complete by:  As directed      Allergies as of 02/09/2018      Reactions   Penicillins Itching, Nausea Only   Tetracyclines & Related Itching, Nausea And Vomiting      Medication List    TAKE these medications   alfuzosin 10 MG 24 hr tablet Commonly known as:  UROXATRAL Take 10 mg by mouth daily with breakfast.   aspirin 81 MG EC tablet Take 1 tablet (81 mg total) by mouth daily.   ramipril 2.5  MG capsule Commonly known as:  ALTACE Take 1 capsule (2.5 mg total) by mouth daily.      Follow-up Information    Shirline Frees, MD Follow up in 1 week(s).   Specialty:  Family Medicine Contact information: [address] [address] [phone]            Time coordinating discharge: 25 min  Signed:  Geradine Girt DO  [facility] 02/09/2018, 1:40 PM

## 2018-02-10 LAB — TYPE AND SCREEN
ABO/RH(D): O POS
ANTIBODY SCREEN: NEGATIVE
Unit division: 0
Unit division: 0

## 2018-02-10 LAB — BPAM RBC
BLOOD PRODUCT EXPIRATION DATE: 201912062359
BLOOD PRODUCT EXPIRATION DATE: 201912062359
ISSUE DATE / TIME: 201911062137
Unit Type and Rh: 5100
Unit Type and Rh: 5100

## 2018-08-18 ENCOUNTER — Other Ambulatory Visit: Payer: Self-pay

## 2018-08-18 ENCOUNTER — Other Ambulatory Visit: Payer: Self-pay | Admitting: Family Medicine

## 2018-08-18 ENCOUNTER — Ambulatory Visit
Admission: RE | Admit: 2018-08-18 | Discharge: 2018-08-18 | Disposition: A | Discharge status: Home / Self Care | Payer: Medicare Other | Source: Ambulatory Visit | Attending: Family Medicine | Admitting: Family Medicine

## 2018-08-18 DIAGNOSIS — R0602 Shortness of breath: Secondary | ICD-10-CM

## 2019-02-08 ENCOUNTER — Other Ambulatory Visit: Payer: Self-pay | Admitting: Urology

## 2019-02-08 DIAGNOSIS — C61 Malignant neoplasm of prostate: Secondary | ICD-10-CM

## 2019-03-06 ENCOUNTER — Other Ambulatory Visit: Payer: Self-pay

## 2019-03-06 ENCOUNTER — Ambulatory Visit
Admission: RE | Admit: 2019-03-06 | Discharge: 2019-03-06 | Disposition: A | Discharge status: Home / Self Care | Payer: Medicare Other | Source: Ambulatory Visit | Attending: Urology | Admitting: Urology

## 2019-03-06 DIAGNOSIS — C61 Malignant neoplasm of prostate: Secondary | ICD-10-CM

## 2019-03-06 MED ORDER — GADOBENATE DIMEGLUMINE 529 MG/ML IV SOLN
19.0000 mL | Freq: Once | INTRAVENOUS | Status: AC | PRN
  Administered 2019-03-06: 19 mL via INTRAVENOUS

## 2019-05-06 ENCOUNTER — Ambulatory Visit: Payer: Medicare Other

## 2019-05-12 ENCOUNTER — Ambulatory Visit: Payer: Medicare Other | Attending: Internal Medicine

## 2019-05-12 DIAGNOSIS — Z23 Encounter for immunization: Secondary | ICD-10-CM

## 2019-05-12 NOTE — Progress Notes (Signed)
   Covid-19 Vaccination Clinic  Name:  Christopher Duran    MRN: [id number] DOB: 02-05-1946  05/12/2019  Mr. Christopher Duran was observed post Covid-19 immunization for 15 minutes without incidence. He was provided with Vaccine Information Sheet and instruction to access the V-Safe system.   Mr. Christopher Duran was instructed to call 911 with any severe reactions post vaccine: Marland Kitchen Difficulty breathing  . Swelling of your face and throat  . A fast heartbeat  . A bad rash all over your body  . Dizziness and weakness    Immunizations Administered    Name Date Dose VIS Date Route   Pfizer COVID-19 Vaccine 05/12/2019  8:29 AM 0.3 mL 03/16/2019 Intramuscular   Manufacturer: [place]   Lot: [id number]   [place]: [id number]

## 2019-05-16 ENCOUNTER — Encounter: Payer: Self-pay | Admitting: Cardiology

## 2019-05-16 ENCOUNTER — Other Ambulatory Visit: Payer: Self-pay

## 2019-05-16 ENCOUNTER — Ambulatory Visit (INDEPENDENT_AMBULATORY_CARE_PROVIDER_SITE_OTHER): Payer: Medicare Other | Admitting: Cardiology

## 2019-05-16 VITALS — BP 140/72 | HR 62 | Ht 71.0 in | Wt 196.0 lb

## 2019-05-16 DIAGNOSIS — R0602 Shortness of breath: Secondary | ICD-10-CM | POA: Diagnosis not present

## 2019-05-16 DIAGNOSIS — Z01812 Encounter for preprocedural laboratory examination: Secondary | ICD-10-CM | POA: Diagnosis not present

## 2019-05-16 DIAGNOSIS — R0789 Other chest pain: Secondary | ICD-10-CM

## 2019-05-16 DIAGNOSIS — R001 Bradycardia, unspecified: Secondary | ICD-10-CM | POA: Diagnosis not present

## 2019-05-16 MED ORDER — METOPROLOL TARTRATE 50 MG PO TABS: 50.0000 mg | ORAL_TABLET | Freq: Once | ORAL | 0 refills | Status: DC

## 2019-05-16 NOTE — Patient Instructions (Addendum)
Medication Instructions:  The current medical regimen is effective;  continue present plan and medications.  *If you need a refill on your cardiac medications before your next appointment, please call your pharmacy*  Lab Work: Please have blood work before your CT scan. (BMP) If you have labs (blood work) drawn today and your tests are completely normal, you will receive your results only by: Marland Kitchen MyChart Message (if you have MyChart) OR . A paper copy in the mail If you have any lab test that is abnormal or we need to change your treatment, we will call you to review the results.  Testing/Procedures: Your physician has requested that you have an echocardiogram. Echocardiography is a painless test that uses sound waves to create images of your heart. It provides your doctor with information about the size and shape of your heart and how well your heart's chambers and valves are working. This procedure takes approximately one hour. There are no restrictions for this procedure.  Your physician has requested that you have cardiac CT. Cardiac computed tomography (CT) is a painless test that uses an x-ray machine to take clear, detailed pictures of your heart. For further information please visit HugeFiesta.[email or web address]. Please follow instruction sheet as given.  Follow-Up: Follow up will be determined after the above testing has been completed.  Thank you for choosing [facility]!!    Your cardiac CT will be scheduled at:   [facility] [address] [place], [place] [address] [phone]  Please arrive at the [facility] main entrance of [facility] 30 minutes prior to test start time. Proceed to the [facility] Radiology Department (first floor) to check-in and test prep.  Please follow these instructions carefully (unless otherwise directed):  Hold all erectile dysfunction medications at least 3 days (72 hrs) prior to test.  On the Night Before the  Test: . Be sure to Drink plenty of water. . Do not consume any caffeinated/decaffeinated beverages or chocolate 12 hours prior to your test. . Do not take any antihistamines 12 hours prior to your test. . If you take Metformin do not take 24 hours prior to test.  On the Day of the Test: . Drink plenty of water. Do not drink any water within one hour of the test. . Do not eat any food 4 hours prior to the test. . You may take your regular medications prior to the test.  . Take metoprolol (Lopressor) two hours prior to test. . HOLD Furosemide/Hydrochlorothiazide morning of the test.  After the Test: . Drink plenty of water. . After receiving IV contrast, you may experience a mild flushed feeling. This is normal. . On occasion, you may experience a mild rash up to 24 hours after the test. This is not dangerous. If this occurs, you can take Benadryl 25 mg and increase your fluid intake. . If you experience trouble breathing, this can be serious. If it is severe call 911 IMMEDIATELY. If it is mild, please call our office. . If you take any of these medications: Glipizide/Metformin, Avandament, Glucavance, please do not take 48 hours after completing test unless otherwise instructed.   Once we have confirmed authorization from your insurance company, we will call you to set up a date and time for your test.   For non-scheduling related questions, please contact the cardiac imaging nurse navigator should you have any questions/concerns: Marchia Bond, RN Navigator Cardiac Imaging [facility] [phone] mobile

## 2019-05-16 NOTE — Progress Notes (Signed)
Cardiology Office Note:    Date:  05/16/2019   ID:  Christopher Duran, DOB December 18, 1945, MRN [id number]  PCP:  Shirline Frees, MD  Cardiologist:  Candee Furbish, MD  Electrophysiologist:  None   Referring MD: Shirline Frees, MD     History of Present Illness:    Christopher Duran is a 74 y.o. male here for the evaluation of mitral valve prolapse at the request of Dr. Kenton Kingfisher.  Has had IBS mitral valve prolapse hypertension prostatitis.  He has been feeling tired weak and low heart rate over the past few months where his heart rate can drop into the upper 40s as detected by his apple watch.  This is transient.  When he is active, his heart rate increases effectively to the 120s.  He has never had any syncope associated with this.  This usually happens, bradycardia that is, when resting or sitting down.  Seems to come and go.  Last spring he was diagnosed with pneumonia.  Been short of breath. Mild chest pain at times left-sided, sometimes associated with exertion but can happen anytime.  Pressure-like.  Father died CVA  He quit smoking in 07/07/71.  He is a retired Environmental health practitioner, Theatre stage manager for [employer]  He has been retired for approximately 15 years.  He has also been sent to pulmonary medicine for review of diagnosis shortness of breath.  Past Medical History:  Diagnosis Date  . BPH (benign prostatic hyperplasia)   . Diarrhea   . Hypertension   . MVP (mitral valve prolapse)     Past Surgical History:  Procedure Laterality Date  . artho     shoulder b/l  . SHOULDER ARTHROSCOPY Bilateral     Current Medications: Current Meds  Medication Sig  . alfuzosin (UROXATRAL) 10 MG 24 hr tablet Take 10 mg by mouth daily with breakfast.  . hydrochlorothiazide (HYDRODIURIL) 12.5 MG tablet Take 12.5 mg by mouth daily.  . ramipril (ALTACE) 10 MG capsule Take 10 mg by mouth 2 (two) times daily.     Allergies:   Penicillins and Tetracyclines & related   Social History    Socioeconomic History  . Marital status: Divorced    Spouse name: Not on file  . Number of children: Not on file  . Years of education: Not on file  . Highest education level: Not on file  Occupational History  . Not on file  Tobacco Use  . Smoking status: Former Research scientist (life sciences)  . Smokeless tobacco: Never Used  . Tobacco comment: 4 years only  Substance and Sexual Activity  . Alcohol use: No    Alcohol/week: 0.0 standard drinks  . Drug use: No  . Sexual activity: Not on file  Other Topics Concern  . Not on file  Social History Narrative  . Not on file   Social Determinants of Health   Financial Resource Strain:   . Difficulty of Paying Living Expenses: Not on file  Food Insecurity:   . Worried About Charity fundraiser in the Last Year: Not on file  . Ran Out of Food in the Last Year: Not on file  Transportation Needs:   . Lack of Transportation (Medical): Not on file  . Lack of Transportation (Non-Medical): Not on file  Physical Activity:   . Days of Exercise per Week: Not on file  . Minutes of Exercise per Session: Not on file  Stress:   . Feeling of Stress : Not on file  Social Connections:   . Frequency  of Communication with Friends and Family: Not on file  . Frequency of Social Gatherings with Friends and Family: Not on file  . Attends Religious Services: Not on file  . Active Member of Clubs or Organizations: Not on file  . Attends Archivist Meetings: Not on file  . Marital Status: Not on file     Family History: The patient's family history includes CVA in his father; Cancer in his mother.  ROS:   Please see the history of present illness.    Denies any fevers chills nausea vomiting syncope bleeding all other systems reviewed and are negative.  EKGs/Labs/Other Studies Reviewed:    The following studies were reviewed today: Prior echocardiogram 2016 showed trivial mitral regurgitation with normal EF.  Office notes from Dr. Kenton Kingfisher reviewed and  summarized as above.  EKG:  EKG is  ordered today.  The ekg ordered today demonstrates 05/16/2019-sinus rhythm 62 no other abnormalities normal intervals.  Recent Labs: No results found for requested labs within last 8760 hours.  Recent Lipid Panel    Component Value Date/Time   CHOL 149 07/03/2014 0430   TRIG 36 07/03/2014 0430   HDL 62 07/03/2014 0430   CHOLHDL 2.4 07/03/2014 0430   VLDL 7 07/03/2014 0430   LDLCALC 80 07/03/2014 0430    Physical Exam:    VS:  BP 140/72   Pulse 62   Ht 5\' 11"  (1.803 m)   Wt 196 lb (88.9 kg)   SpO2 98%   BMI 27.34 kg/m     Wt Readings from Last 3 Encounters:  05/16/19 196 lb (88.9 kg)  02/08/18 200 lb (90.7 kg)  07/03/14 198 lb 4.8 oz (89.9 kg)     GEN:  Well nourished, well developed in no acute distress HEENT: Normal NECK: No JVD; No carotid bruits LYMPHATICS: No lymphadenopathy CARDIAC: RRR, no murmurs, rubs, gallops RESPIRATORY:  Clear to auscultation without rales, wheezing or rhonchi  ABDOMEN: Soft, non-tender, non-distended MUSCULOSKELETAL:  No edema; No deformity  SKIN: Warm and dry NEUROLOGIC:  Alert and oriented x 3 PSYCHIATRIC:  Normal affect   ASSESSMENT:    1. Chest pressure   2. Shortness of breath   3. Pre-procedure lab exam   4. Bradycardia    PLAN:    In order of problems listed above:  Bradycardia -Transient, picked up by his apple watch.  Does not have any evidence of chronotropic incompetence, i.e. he is able to increase his heart rate effectively with motion.  He has not had any high risk symptoms such as syncope.  His EKG shows normal rhythm with normal intervals.  This is benign.  I do not think strongly that we need to proceed with monitoring.  His apple watch can be helpful.  He feels reassurance.  Chest pain/pressure -We will go ahead and check a coronary CT scan with possible FFR analysis.  This will be helpful to detect any significant plaque that may alter course of therapy.  Shortness of  breath -We will check an echocardiogram to ensure proper structure and function of his heart.  Essential hypertension -Continue with Altace 10 mg twice a day. -Creatinine is 1.0, LDL 62, hemoglobin 12.8, TSH 1.02    Medication Adjustments/Labs and Tests Ordered: Current medicines are reviewed at length with the patient today.  Concerns regarding medicines are outlined above.  Orders Placed This Encounter  Procedures  . CT CORONARY MORPH W/CTA COR W/SCORE W/CA W/CM &/OR WO/CM  . CT CORONARY FRACTIONAL FLOW RESERVE DATA  PREP  . CT CORONARY FRACTIONAL FLOW RESERVE FLUID ANALYSIS  . Basic metabolic panel  . EKG 12-Lead  . ECHOCARDIOGRAM COMPLETE   Meds ordered this encounter  Medications  . metoprolol tartrate (LOPRESSOR) 50 MG tablet    Sig: Take 1 tablet (50 mg total) by mouth once for 1 dose. Take one tablet 2 hours before your CT scan    Dispense:  1 tablet    Refill:  0    Patient Instructions  Medication Instructions:  The current medical regimen is effective;  continue present plan and medications.  *If you need a refill on your cardiac medications before your next appointment, please call your pharmacy*  Lab Work: Please have blood work before your CT scan. (BMP) If you have labs (blood work) drawn today and your tests are completely normal, you will receive your results only by: Marland Kitchen MyChart Message (if you have MyChart) OR . A paper copy in the mail If you have any lab test that is abnormal or we need to change your treatment, we will call you to review the results.  Testing/Procedures: Your physician has requested that you have an echocardiogram. Echocardiography is a painless test that uses sound waves to create images of your heart. It provides your doctor with information about the size and shape of your heart and how well your heart's chambers and valves are working. This procedure takes approximately one hour. There are no restrictions for this procedure.  Your  physician has requested that you have cardiac CT. Cardiac computed tomography (CT) is a painless test that uses an x-ray machine to take clear, detailed pictures of your heart. For further information please visit [email or web address].[email or web address]. Please follow instruction sheet as given.  Follow-Up: Follow up will be determined after the above testing has been completed.  Thank you for choosing [facility]!!    Your cardiac CT will be scheduled at:   [facility] [address] [place], [place] [address] [phone]  Please arrive at the [facility] main entrance of [facility] 30 minutes prior to test start time. Proceed to the [facility] Radiology Department (first floor) to check-in and test prep.  Please follow these instructions carefully (unless otherwise directed):  Hold all erectile dysfunction medications at least 3 days (72 hrs) prior to test.  On the Night Before the Test: . Be sure to Drink plenty of water. . Do not consume any caffeinated/decaffeinated beverages or chocolate 12 hours prior to your test. . Do not take any antihistamines 12 hours prior to your test. . If you take Metformin do not take 24 hours prior to test.  On the Day of the Test: . Drink plenty of water. Do not drink any water within one hour of the test. . Do not eat any food 4 hours prior to the test. . You may take your regular medications prior to the test.  . Take metoprolol (Lopressor) two hours prior to test. . HOLD Furosemide/Hydrochlorothiazide morning of the test.  After the Test: . Drink plenty of water. . After receiving IV contrast, you may experience a mild flushed feeling. This is normal. . On occasion, you may experience a mild rash up to 24 hours after the test. This is not dangerous. If this occurs, you can take Benadryl 25 mg and increase your fluid intake. . If you experience trouble breathing, this can be serious. If it is severe call 911 IMMEDIATELY. If  it is mild, please call our  office. . If you take any of these medications: Glipizide/Metformin, Avandament, Glucavance, please do not take 48 hours after completing test unless otherwise instructed.   Once we have confirmed authorization from your insurance company, we will call you to set up a date and time for your test.   For non-scheduling related questions, please contact the cardiac imaging nurse navigator should you have any questions/concerns: Marchia Bond, RN Navigator Cardiac Imaging [facility] [phone] mobile      Signed, Candee Furbish, MD  05/16/2019 2:52 PM    [place]

## 2019-05-17 ENCOUNTER — Ambulatory Visit: Payer: Medicare Other

## 2019-05-18 ENCOUNTER — Encounter: Payer: Self-pay | Admitting: *Deleted

## 2019-05-18 ENCOUNTER — Other Ambulatory Visit: Payer: Self-pay

## 2019-05-18 ENCOUNTER — Ambulatory Visit (INDEPENDENT_AMBULATORY_CARE_PROVIDER_SITE_OTHER): Payer: Medicare Other | Admitting: Emergency Medicine

## 2019-05-18 ENCOUNTER — Encounter: Payer: Self-pay | Admitting: Emergency Medicine

## 2019-05-18 VITALS — BP 134/86 | HR 62 | Ht 71.0 in | Wt 203.0 lb

## 2019-05-18 DIAGNOSIS — R0602 Shortness of breath: Secondary | ICD-10-CM | POA: Diagnosis not present

## 2019-05-18 DIAGNOSIS — R0600 Dyspnea, unspecified: Secondary | ICD-10-CM | POA: Insufficient documentation

## 2019-05-18 NOTE — Assessment & Plan Note (Signed)
Etiology not clear but the timing correlates with his treatment for possible pneumonia back in March 2020.  He never returned fully to baseline.  He has a cardiology evaluation ongoing.  I believe we need to evaluate for possible evolving obstructive lung disease.  He may have had subclinical asthma that is now active.  If we can rule out obstructive lung disease or restrictive process then consider upper airway phenomenon in the setting of his allergic rhinitis.  We will check PFT, check chest x-ray today to ensure no evolving effusion or new findings compared with May.  We will repeat your chest x-ray today to compare with your prior from May. We will arrange for pulmonary function testing at your next office visit Get your other studies as planned by Dr. Marlou Porch Follow with Dr. Lamonte Sakai next available with full pulmonary function testing on the same day.

## 2019-05-18 NOTE — Progress Notes (Signed)
Subjective:    Patient ID: Christopher Duran, male    DOB: February 01, 1946, 74 y.o.   MRN: [id number]  HPI 74 year old former smoker (4 pack years) with a history of hypertension on ACE inhibitor (7 yrs), iron and B12 deficient anemia, prostate cancer, mitral valve prolapse, IBS.  He is referred today for evaluation of shortness of breath.  He apparently had a pneumonia in 06/2018, at which time he had shortness of breath, chest tightness. Was treated with levaquin, was given albuterol - he believes this helps him. He continues to have difficulty with breathing to a lesser degree. Has some associated chest tightness, again not always associated w exertion. Has had to stop when doing yard work. He is sensitive to grasses and allergens, sometimes make the breathing worse. Has a lot of drainage to her throat. Uses saline nasal spray prn, is using loratadine.   Recently seen by Dr. Marlou Porch of cardiology for symptoms of weakness, some observed episodes of bradycardia and tachycardia.  Has never had syncope.  Coronary CT scan was ordered and is pending as well as an echocardiogram.  Chest x-ray from 08/20/2018 reviewed by me, shows normal heart shadow, no infiltrates, no acute abnormalities.  2 daughters have asthma.    Review of Systems As above.   Past Medical History:  Diagnosis Date  . BPH (benign prostatic hyperplasia)   . Diarrhea   . Hypertension   . IBS (irritable bowel syndrome)   . MVP (mitral valve prolapse)      Family History  Problem Relation Age of Onset  . CVA Father   . Cancer Mother      Social History   Socioeconomic History  . Marital status: Divorced    Spouse name: Not on file  . Number of children: Not on file  . Years of education: Not on file  . Highest education level: Not on file  Occupational History  . Not on file  Tobacco Use  . Smoking status: Former Smoker    Packs/day: 1.00    Years: 4.00    Pack years: 4.00    Types: Cigarettes    Quit date:  12/14/1971    Years since quitting: 47.4  . Smokeless tobacco: Never Used  . Tobacco comment: 4 years only  Substance and Sexual Activity  . Alcohol use: No    Alcohol/week: 0.0 standard drinks  . Drug use: No  . Sexual activity: Not on file  Other Topics Concern  . Not on file  Social History Narrative  . Not on file   Social Determinants of Health   Financial Resource Strain:   . Difficulty of Paying Living Expenses: Not on file  Food Insecurity:   . Worried About Charity fundraiser in the Last Year: Not on file  . Ran Out of Food in the Last Year: Not on file  Transportation Needs:   . Lack of Transportation (Medical): Not on file  . Lack of Transportation (Non-Medical): Not on file  Physical Activity:   . Days of Exercise per Week: Not on file  . Minutes of Exercise per Session: Not on file  Stress:   . Feeling of Stress : Not on file  Social Connections:   . Frequency of Communication with Friends and Family: Not on file  . Frequency of Social Gatherings with Friends and Family: Not on file  . Attends Religious Services: Not on file  . Active Member of Clubs or Organizations: Not on file  . Attends  Club or Organization Meetings: Not on file  . Marital Status: Not on file  Intimate Partner Violence:   . Fear of Current or Ex-Partner: Not on file  . Emotionally Abused: Not on file  . Physically Abused: Not on file  . Sexually Abused: Not on file     Allergies  Allergen Reactions  . Penicillins Itching and Nausea Only  . Tetracyclines & Related Itching and Nausea And Vomiting  . Zithromax [Azithromycin]      Outpatient Medications Prior to Visit  Medication Sig Dispense Refill  . albuterol (PROAIR HFA) 108 (90 Base) MCG/ACT inhaler Inhale 2 puffs into the lungs every 6 (six) hours as needed for wheezing or shortness of breath.    . alfuzosin (UROXATRAL) 10 MG 24 hr tablet Take 10 mg by mouth daily with breakfast.    . hydrochlorothiazide (HYDRODIURIL) 12.5 MG  tablet Take 12.5 mg by mouth daily.    . hydrocortisone (ANUSOL-HC) 25 MG suppository Place 25 mg rectally 2 (two) times daily as needed for hemorrhoids or anal itching.    . ramipril (ALTACE) 10 MG capsule Take 10 mg by mouth 2 (two) times daily.     No facility-administered medications prior to visit.   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        Objective:   Physical Exam Vitals:   05/18/19 1525  BP: 134/86  Pulse: 62  SpO2: 100%  Weight: 203 lb (92.1 kg)  Height: 5\' 11"  (1.803 m)   Gen: Pleasant, well-nourished, in no distress,  normal affect  ENT: No lesions,  mouth clear,  oropharynx clear, no postnasal drip  Neck: No JVD, no stridor  Lungs: No use of accessory muscles, no crackles or wheezing on normal respiration, no wheeze on forced expiration  Cardiovascular: RRR, heart sounds normal, no murmur or gallops, no peripheral edema  Musculoskeletal: No deformities, no cyanosis or clubbing  Neuro: alert, awake, non focal  Skin: Warm, no lesions or rash      Assessment & Plan:  Dyspnea Etiology not clear but the timing correlates with his treatment for possible pneumonia back in March 2020.  He never returned fully to baseline.  He has a cardiology evaluation ongoing.  I believe we need to evaluate for possible evolving obstructive lung disease.  He may have had subclinical asthma that is now active.  If we can rule out obstructive lung disease or restrictive process then consider upper airway phenomenon in the setting of his allergic rhinitis.  We will check PFT, check chest x-ray today to ensure no evolving effusion or new findings compared with May.  We will repeat your chest x-ray today to compare with your prior from May. We will arrange for pulmonary function testing at your next office visit Get your  other studies as planned by Dr. Marlou Porch Follow with Dr. Lamonte Sakai next available with full pulmonary function testing on the same day.     Baltazar Apo, MD, PhD 05/18/2019, 3:59 PM [facility] [phone] or if no answer [phone]

## 2019-05-18 NOTE — Patient Instructions (Signed)
We will repeat your chest x-ray today to compare with your prior from May. We will arrange for pulmonary function testing at your next office visit Get your other studies as planned by Dr. Marlou Porch Follow with Dr. Lamonte Sakai next available with full pulmonary function testing on the same day.

## 2019-05-21 ENCOUNTER — Ambulatory Visit (INDEPENDENT_AMBULATORY_CARE_PROVIDER_SITE_OTHER): Payer: Medicare Other

## 2019-05-21 DIAGNOSIS — R0600 Dyspnea, unspecified: Secondary | ICD-10-CM

## 2019-05-31 ENCOUNTER — Ambulatory Visit (HOSPITAL_COMMUNITY): Payer: Medicare Other | Attending: Cardiovascular Disease

## 2019-05-31 ENCOUNTER — Other Ambulatory Visit: Payer: Self-pay

## 2019-05-31 DIAGNOSIS — R0602 Shortness of breath: Secondary | ICD-10-CM | POA: Insufficient documentation

## 2019-06-06 ENCOUNTER — Ambulatory Visit: Payer: Medicare Other | Attending: Internal Medicine

## 2019-06-06 DIAGNOSIS — Z23 Encounter for immunization: Secondary | ICD-10-CM | POA: Insufficient documentation

## 2019-06-06 NOTE — Progress Notes (Signed)
   Covid-19 Vaccination Clinic  Name:  Christopher Duran    MRN: [id number] DOB: 12/26/1945  06/06/2019  Christopher Duran was observed post Covid-19 immunization for 15 minutes without incident. He was provided with Vaccine Information Sheet and instruction to access the V-Safe system.   Christopher Duran was instructed to call 911 with any severe reactions post vaccine: Marland Kitchen Difficulty breathing  . Swelling of face and throat  . A fast heartbeat  . A bad rash all over body  . Dizziness and weakness   Immunizations Administered    Name Date Dose VIS Date Route   Pfizer COVID-19 Vaccine 06/06/2019  8:18 AM 0.3 mL 03/16/2019 Intramuscular   Manufacturer: [place]   Lot: [id number]   [place]: [id number]

## 2019-06-21 ENCOUNTER — Other Ambulatory Visit: Payer: Medicare Other | Admitting: *Deleted

## 2019-06-21 ENCOUNTER — Other Ambulatory Visit: Payer: Self-pay

## 2019-06-21 DIAGNOSIS — R0602 Shortness of breath: Secondary | ICD-10-CM

## 2019-06-21 DIAGNOSIS — Z01812 Encounter for preprocedural laboratory examination: Secondary | ICD-10-CM

## 2019-06-21 LAB — BASIC METABOLIC PANEL
BUN/Creatinine Ratio: 16 (ref 10–24)
BUN: 20 mg/dL (ref 8–27)
CO2: 24 mmol/L (ref 20–29)
Calcium: 9.4 mg/dL (ref 8.6–10.2)
Chloride: 100 mmol/L (ref 96–106)
Creatinine, Ser: 1.28 mg/dL — ABNORMAL HIGH (ref 0.76–1.27)
GFR calc Af Amer: 65 mL/min/{1.73_m2} (ref >59)
GFR calc non Af Amer: 56 mL/min/{1.73_m2} — ABNORMAL LOW (ref >59)
Glucose: 92 mg/dL (ref 65–99)
Potassium: 4.8 mmol/L (ref 3.5–5.2)
Sodium: 135 mmol/L (ref 134–144)

## 2019-06-27 ENCOUNTER — Encounter (HOSPITAL_COMMUNITY): Payer: Self-pay

## 2019-06-28 ENCOUNTER — Other Ambulatory Visit: Payer: Self-pay

## 2019-06-28 ENCOUNTER — Ambulatory Visit (HOSPITAL_COMMUNITY)
Admission: RE | Admit: 2019-06-28 | Discharge: 2019-06-28 | Disposition: A | Discharge status: Home / Self Care | Payer: Medicare Other | Source: Ambulatory Visit | Attending: Cardiology | Admitting: Cardiology

## 2019-06-28 DIAGNOSIS — R0602 Shortness of breath: Secondary | ICD-10-CM | POA: Insufficient documentation

## 2019-06-28 DIAGNOSIS — K7689 Other specified diseases of liver: Secondary | ICD-10-CM | POA: Diagnosis not present

## 2019-06-28 DIAGNOSIS — R0789 Other chest pain: Secondary | ICD-10-CM

## 2019-06-28 MED ORDER — NITROGLYCERIN 0.4 MG SL SUBL
SUBLINGUAL_TABLET | SUBLINGUAL | Status: AC
  Administered 2019-06-28: 0.8 mg via SUBLINGUAL
  Filled 2019-06-28: qty 2

## 2019-06-28 MED ORDER — IOHEXOL 350 MG/ML SOLN
80.0000 mL | Freq: Once | INTRAVENOUS | Status: AC | PRN
  Administered 2019-06-28: 80 mL via INTRAVENOUS

## 2019-06-28 MED ORDER — NITROGLYCERIN 0.4 MG SL SUBL: 0.8000 mg | SUBLINGUAL_TABLET | Freq: Once | SUBLINGUAL | Status: AC

## 2019-07-21 ENCOUNTER — Other Ambulatory Visit (HOSPITAL_COMMUNITY)
Admission: RE | Admit: 2019-07-21 | Discharge: 2019-07-21 | Disposition: A | Discharge status: Home / Self Care | Payer: Medicare Other | Source: Ambulatory Visit | Attending: Emergency Medicine | Admitting: Emergency Medicine

## 2019-07-21 DIAGNOSIS — Z01812 Encounter for preprocedural laboratory examination: Secondary | ICD-10-CM | POA: Insufficient documentation

## 2019-07-21 DIAGNOSIS — Z20822 Contact with and (suspected) exposure to covid-19: Secondary | ICD-10-CM | POA: Insufficient documentation

## 2019-07-21 LAB — SARS CORONAVIRUS 2 (TAT 6-24 HRS): SARS Coronavirus 2: NEGATIVE

## 2019-07-24 ENCOUNTER — Ambulatory Visit (INDEPENDENT_AMBULATORY_CARE_PROVIDER_SITE_OTHER): Payer: Medicare Other | Admitting: Emergency Medicine

## 2019-07-24 ENCOUNTER — Other Ambulatory Visit: Payer: Self-pay

## 2019-07-24 DIAGNOSIS — R0600 Dyspnea, unspecified: Secondary | ICD-10-CM | POA: Diagnosis not present

## 2019-07-24 LAB — PULMONARY FUNCTION TEST
DL/VA % pred: 111 %
DL/VA: 4.47 ml/min/mmHg/L
DLCO cor % pred: 103 %
DLCO cor: 28.39 ml/min/mmHg
DLCO unc % pred: 103 %
DLCO unc: 28.39 ml/min/mmHg
FEF 25-75 Post: 2.95 L/sec
FEF 25-75 Pre: 2.69 L/sec
FEF2575-%Change-Post: 9 %
FEF2575-%Pred-Post: 111 %
FEF2575-%Pred-Pre: 102 %
FEV1-%Change-Post: 2 %
FEV1-%Pred-Post: 101 %
FEV1-%Pred-Pre: 99 %
FEV1-Post: 3.17 L
FEV1-Pre: 3.11 L
FEV1FVC-%Change-Post: 2 %
FEV1FVC-%Pred-Pre: 102 %
FEV6-%Change-Post: 0 %
FEV6-%Pred-Post: 100 %
FEV6-%Pred-Pre: 100 %
FEV6-Post: 3.96 L
FEV6-Pre: 3.97 L
FEV6FVC-%Change-Post: 0 %
FEV6FVC-%Pred-Post: 104 %
FEV6FVC-%Pred-Pre: 104 %
FVC-%Change-Post: 0 %
FVC-%Pred-Post: 96 %
FVC-%Pred-Pre: 96 %
FVC-Post: 3.97 L
FVC-Pre: 3.99 L
Post FEV1/FVC ratio: 80 %
Post FEV6/FVC ratio: 100 %
Pre FEV1/FVC ratio: 78 %
Pre FEV6/FVC Ratio: 100 %
RV % pred: 101 %
RV: 2.62 L
TLC % pred: 91 %
TLC: 6.83 L

## 2019-07-24 NOTE — Progress Notes (Signed)
PFT done today. 

## 2019-08-09 ENCOUNTER — Telehealth: Payer: Self-pay | Admitting: Emergency Medicine

## 2019-08-09 NOTE — Telephone Encounter (Signed)
Tried calling pt and there was no answer  Need to inform him we are going to send msg to [facility] to inquire about his PFT results   Dr Lamonte Sakai, please advise on PFT's, thanks!

## 2019-08-14 NOTE — Telephone Encounter (Signed)
Please let him know that his pulmonary function testing shows overall normal airflows, normal lung volumes and normal gas exchange.  This is all good news.

## 2019-08-14 NOTE — Telephone Encounter (Signed)
Advised pt of results. Pt understood and nothing further is needed.   

## 2020-08-27 ENCOUNTER — Other Ambulatory Visit: Payer: Self-pay | Admitting: Urology

## 2020-08-27 DIAGNOSIS — N201 Calculus of ureter: Secondary | ICD-10-CM

## 2020-08-28 ENCOUNTER — Encounter (HOSPITAL_BASED_OUTPATIENT_CLINIC_OR_DEPARTMENT_OTHER): Payer: Self-pay | Admitting: Urology

## 2020-08-28 NOTE — Progress Notes (Signed)
Left message for patient to return call for ESWL instructions. 

## 2020-08-28 NOTE — Progress Notes (Signed)
Patient to arrive at 1430 on 09/04/2020. History and medications reviewed. Pre-procedure instructions given. NPO after 0830 day of procedure. Clear liquids until 1230. BP medications in in am per usual schedule. History of MVP, asymptomatic. Last EKG on 05/16/2019. No EKG needed per [facility].Driver secured.

## 2020-09-03 NOTE — H&P (Signed)
08/22/2020: CT hematuria imaging ordered after last office visit identified a minimally obstructing distal 4 mm calculus on the left side. Follow-up KUB confirmed this as well, easily identified just distal to the left sacral wing. Outside of an enlarged prostate, there were no other concerning GU findings including absence of additional renal calculi, mass or lesion.   Back today discuss results and determining plan of care moving forward. Since last evaluation, he has had some intermittent bilateral lower abdominal pain and discomfort correlated with bilateral lower back pain that he endorses occurred after doing some strenuous physical activity. That has since resolved. Voiding symptoms grossly stable, unchanged since time of last office visit evaluation. He continues alfuzosin. Denies interval stone material passage, gross hematuria, burning or painful urination. He specifically denies any exacerbations of left-sided pain or discomfort suggestive of obstructive uropathy. He remains afebrile without nausea or vomiting. Urinalysis is clear today.   07/30/20: Christopher Duran returns today in f/u of his history of low volume Gleason 7 prostate cancer on surveillance, BPH with BOO, ED , IC and chronic prostatitis. His PSA is back down to 3.54. He remains on alfuzosin and his IPSS is 15. He has had to use the Cipro about 2 weeks ago for recurrent prostatitis with increased frequency. He has had no flank pain or hematuria. His UA has 20-40 RBC's today which is a new issue for him. Her remains on sildenafil with success.    Gu Hx: Christopher Duran has a history of prostate cancer, BPH with BOO, ED, interstitial cystitis and CPPS who was noted to have a rising PSA up to 3.68 prompting a TRUS biopsy of the prostate on 07/24/18. This demonstrated Gleason 3+4=7 adenocarcinoma with 2 out of 12 biopsy cores positive for malignancy.   He had an MRI fusion biopsy in 1/21 for active surveillance of low volume Gleason 7 prostate cancer. The  MRI showed PIRAD3 ROI's in the left lateral apex and right mid medial prostate. ROI's were negative for cancer but he had 1 core with low volume Gleason 7(3+4) and one core with low volume Gleason 6 both on the right side. His PSA is down slightly at 3.66 prior to this visit consistent with non-progression of his prostate cancer.   Family history: None.   Imaging studies: None.   PMH: He has a history of hypertension, GERD, irritable bowel syndrome, and interstitial cystitis. He also has had significant anemia requiring a blood transfusion last fall. It is been felt that this is related to GI bleeding although from a benign source after an extensive evaluation. He does have significant difficulties with hemorrhoids and bleeding related to hemorrhoids. He also has chronic diarrhea related to his irritable bowel syndrome which is quite bothersome.  PSH: No abdominal surgeries.   TNM stage: cT1c Nx Mx  PSA: 3.68  Gleason score: 3+4=7  Biopsy (07/24/18): 2/12 cores positive  Left: Benign  Right: R apex (5%, 3+3=6), R mid (10%, 3+4=7)  Prostate volume: 41 cc   MR Biopsy (04/23/19) 2/18 cores positive. 2 MRI ROI's negative.  Right: R L apex 20% Gleason 6, R M mid 10% Gleason 7(3+4)   Nomogram  OC disease: 57%  EPE: 42%  SVI: 2%  LNI: 2%  PFS (5 year, 10 year): 91%, 85%     ALLERGIES: Penicillins - Itching    MEDICATIONS: Alfuzosin Hcl Er 10 mg tablet, extended release 24 hr 1 tablet PO Daily  Cipro 500 mg tablet 1 tablet PO BID PRN  Hydrochlorothiazide  Claritin  10 mg capsule 1 capsule PO Q AM  Ramipril  Sildenafil  Sildenafil Citrate 100 mg tablet 1 tablet PO Daily PRN  Vitamin B 12 Injection     GU PSH: Locm 300-399Mg /Ml Iodine,1Ml - 08/14/2020 Prostate Needle Biopsy - 04/23/2019, 2020       PSH Notes: Shoulder Surgery   NON-GU PSH: Surgical Pathology, Gross And Microscopic Examination For Prostate Needle - 04/23/2019, 2020     GU PMH: Renal calculus -  08/19/2020 Microscopic hematuria - 08/14/2020, He has not had hematuria before. I will get a CT and have him return for cystoscopy. , - 07/30/2020 BPH w/LUTS, HE will continue the alfuzosin - 07/30/2020, - 04/24/2020, Benign prostatic hyperplasia with urinary obstruction, - 2017 Chronic prostatitis, He continues to use Cipro intermittently. - 07/30/2020, He had a flare 3 weeks ago that resolved with a course of Cipro., - 04/24/2020 (Stable), He took a round of Cipro 2-3 weeks ago but is doing well now. , - 02/22/2018, He has no had a recent flair but I refilled the cipro should he need it. , - 2019, He is doing well on current therapy but did have to use Cipro 3 weeks ago. , - 2018 (Stable), He last used cipro in March. F/U in 6 months. , - 2018, Still uses Cipro at times. , - 2017, Prostatitis, chronic, - 2017 Elevated PSA - 07/30/2020, - 2020, His PSA has been slowly rising over the past few years but has been variable. I will repeat it with a week of abstinence in about a month and if it remains elevated he will need a biopsy. If it is back down, I will recheck in 6 months. , - 2019 Interstitial Cystitis (w/o hematuria) - 07/30/2020, Chronic interstitial cystitis without hematuria, - 2017 Prostate Cancer, His PSA is down and his exam is unremarkable. Fu in 6 months with a PSA. - 07/30/2020, - 04/24/2020 (Stable), - 2020, T1c Nx Mx favorable intermediate risk prostate cancer with a 67ml prostate, moderate LUTS, ED and a history of IC. I am concern that with his IC/CP/CPPS he wouldn't do well with radiation. I am going to have him speak to Dr. Alinda Money about surgery but will arrange a 3 month f/u visit with PSA. , - 2020 Weak Urinary Stream - 07/30/2020, (Stable), - 10/27/2018, Weak urinary stream, - 2016 ED due to arterial insufficiency, This remains stable, well managed with prn use of sildenafil. - 04/24/2020, (Stable), - 2020, Erectile dysfunction due to arterial insufficiency, - 2017 Nocturia - 04/24/2020, -  2017 Urinary Frequency, Urinary frequency - 2017 Low back pain, Lumbago - 2015 Chronic Kidney Disease, Chronic Renal Insufficiency - 2014 Lower abdominal pain, unspecified, Groin (Inguinal) Pain Bilaterally - 2014 Peyronies Disease, Peyronie's disease - 2014      PMH Notes:  2006-03-30 11:47:36 - Note: Arthritis  anemia     NON-GU PMH: Encounter for general adult medical examination without abnormal findings, Encounter for preventive health examination - 2015 Cardiac murmur, unspecified, Murmurs - 2014 Irritable bowel syndrome with diarrhea, Irritable Bowel Syndrome - 2014 Muscle weakness (generalized), Muscle weakness - 2014 Other lack of coordination, Other lack of coordination - 2014 Other muscle spasm, Muscle spasm - 2014 Personal history of other diseases of the circulatory system, History of hypertension - 2014 Personal history of other diseases of the digestive system, History of esophageal reflux - 2014 Anemia, unspecified    FAMILY HISTORY: Death In The Family Father - Father Death In The Family Mother - Mother Family Health Status  Number - Runs In Family Hypertension - Runs In Family Prostate Cancer - Runs In Family   SOCIAL HISTORY: Marital Status: Divorced Preferred Language: English; Race: Black or African American Current Smoking Status: Patient does not smoke anymore.  Does not drink caffeine.     Notes: Former smoker, Activities Of Daily Living, Self-reliant In Usual Daily Activities, Exercise Habits, Living Independently, Alcohol Use, Tobacco Use, Marital History - Divorced, Occupation:   REVIEW OF SYSTEMS:    GU Review Male:   Patient denies frequent urination, hard to postpone urination, burning/ pain with urination, get up at night to urinate, leakage of urine, stream starts and stops, trouble starting your stream, have to strain to urinate , erection problems, and penile pain.  Gastrointestinal (Upper):   Patient denies nausea, vomiting, and indigestion/  heartburn.  Gastrointestinal (Lower):   Patient denies diarrhea and constipation.  Constitutional:   Patient denies fever, night sweats, weight loss, and fatigue.  Skin:   Patient denies skin rash/ lesion and itching.  Eyes:   Patient denies blurred vision and double vision.  Ears/ Nose/ Throat:   Patient denies sore throat and sinus problems.  Hematologic/Lymphatic:   Patient denies swollen glands and easy bruising.  Cardiovascular:   Patient denies leg swelling and chest pains.  Respiratory:   Patient denies cough and shortness of breath.  Endocrine:   Patient denies excessive thirst.  Musculoskeletal:   Patient denies back pain and joint pain.  Neurological:   Patient denies headaches and dizziness.  Psychologic:   Patient denies depression and anxiety.   Notes: Updated from previous visit 07/30/2020 with review from patient as noted above.   VITAL SIGNS:      08/22/2020 08:02 AM  Weight 201 lb / 91.17 kg  Height 71 in / 180.34 cm  BP 194/83 mmHg  Pulse 68 /min  Temperature 97.3 F / 36.2 C  BMI 28.0 kg/m   MULTI-SYSTEM PHYSICAL EXAMINATION:    Constitutional: Well-nourished. No physical deformities. Normally developed. Good grooming.  Neck: Neck symmetrical, not swollen. Normal tracheal position.  Respiratory: No labored breathing, no use of accessory muscles.   Cardiovascular: Normal temperature, normal extremity pulses, no swelling, no varicosities.  Skin: No paleness, no jaundice, no cyanosis. No lesion, no ulcer, no rash.  Neurologic / Psychiatric: Oriented to time, oriented to place, oriented to person. No depression, no anxiety, no agitation.  Gastrointestinal: No mass, no tenderness, no rigidity, non obese abdomen.  Musculoskeletal: Normal gait and station of head and neck.     Complexity of Data:  Source Of History:  Patient, Family/Caregiver, Medical Record Summary  Lab Test Review:   PSA  Records Review:   Pathology Reports, Previous Doctor Records, Previous  Hospital Records, Previous Patient Records  Urine Test Review:   Urinalysis, Urine Culture  X-Ray Review: KUB: Reviewed Films. Discussed With Patient.  C.T. Hematuria: Reviewed Films. Reviewed Report.     07/23/20 04/14/20 10/09/19 01/26/19 10/23/18 05/25/18 02/15/18 09/22/17  PSA  Total PSA 3.54 ng/mL 4.14 ng/mL 3.66 ng/mL 3.89 ng/mL 3.55 ng/mL 3.68 ng/mL 3.42 ng/mL 2.98 ng/mL    05/06/04  Hormones  Testosterone, Total 4.20    Notes:                     CLINICAL DATA: Microhematuria   EXAM:  CT ABDOMEN AND PELVIS WITHOUT AND WITH CONTRAST   TECHNIQUE:  Multidetector CT imaging of the abdomen and pelvis was performed  following the standard protocol before and following the  bolus  administration of intravenous contrast.   CONTRAST: 125 mL Omnipaque 300 iodinated contrast IV   COMPARISON: 02/25/2012   FINDINGS:  Lower chest: No acute abnormality.   Hepatobiliary: No solid liver abnormality is seen. Numerous simple  hepatic cysts and/or hemangiomata. No gallstones, gallbladder wall  thickening, or biliary dilatation.   Pancreas: Unremarkable. No pancreatic ductal dilatation or  surrounding inflammatory changes.   Spleen: Normal in size without significant abnormality.   Adrenals/Urinary Tract: Adrenal glands are unremarkable. There is a  4 mm calculus in the most distal left ureter without significant  hydronephrosis or hydroureter. The ureter is opacified distally to  this calculus and the ureteral jet is visualized. No additional  calculi identified. No urinary tract filling defect on delayed phase  imaging. Bladder is unremarkable.   Stomach/Bowel: Stomach is within normal limits. Appendix appears  normal. No evidence of bowel wall thickening, distention, or  inflammatory changes.   Vascular/Lymphatic: No significant vascular findings are present. No  enlarged abdominal or pelvic lymph nodes.   Reproductive: Prostatomegaly.   Other: No abdominal wall hernia or  abnormality. No abdominopelvic  ascites.   Musculoskeletal: No acute or significant osseous findings. Coarse,  trabeculated appearance of the left ilium and sacrum, unchanged  compared to prior examination and consistent with Paget's disease.   IMPRESSION:  1. There is a 4 mm calculus in the most distal left ureter without  significant hydronephrosis or hydroureter. The ureter is opacified  distally to this calculus and the ureteral jet is visualized. No  additional calculi identified.  2. No suspicious mass or urinary tract filling defect on delayed  phase imaging.  3. Prostatomegaly.    Electronically Signed  By: Eddie Candle M.D.  On: 08/15/2020 08:28      PROCEDURES:          Urinalysis Dipstick Dipstick Cont'd  Color: Yellow Bilirubin: Neg mg/dL  Appearance: Clear Ketones: Neg mg/dL  Specific Gravity: 1.020 Blood: Neg ery/uL  pH: <=5.0 Protein: Neg mg/dL  Glucose: Neg mg/dL Urobilinogen: 0.2 mg/dL    Nitrites: Neg    Leukocyte Esterase: Neg leu/uL    ASSESSMENT:      ICD-10 Details  1 GU:   Ureteral calculus - N20.1 Left, Undiagnosed New Problem   PLAN:           Orders Labs Urine Culture          Schedule Return Visit/Planned Activity: Next Available Appointment - Schedule Surgery          Document Letter(s):  Created for Patient: Clinical Summary         Notes:   CT and recent KUB results reviewed in detail. He denies interval stone material passage. Urinalysis is clear today. He has had some intermittent generalized lower quadrant abdominal pain with correlating bilateral lower back pain as well. Not specific to the left side or described as radiating to suggest some underlying obstructive uropathy. He states the pain and discomfort likely occurred after doing some strenuous activity, now resolved.  We discussed ongoing plan of care moving forward.  For observation I described the risks which include but are not limited to silent renal damage,  life-threatening infection, need for emergent surgery, failure to pass stone, and pain.   For ureteroscopy I described the risks which include heart attack, stroke, pulmonary embolus, death, bleeding, infection, damage to contiguous structures, positioning injury, ureteral stricture, ureteral avulsion, ureteral injury, need for ureteral stent, inability to perform ureteroscopy, need for an interval procedure, inability  to clear stone burden, stent discomfort and pain.   For shockwave lithotripsy I described the risks which include arrhythmia, kidney contusion, kidney hemorrhage, need for transfusion, long-term risk of diabetes or hypertension, back discomfort, flank ecchymosis, flank abrasion, inability to break up stone, inability to pass stone fragments, Steinstrasse, infection associated with obstructing stones, need for different surgical procedure and possible need for repeat shockwave lithotripsy.   Ultimately the patient would prefer shockwave lithotripsy. I will discuss this with his urologist and if appropriately indicated, he will be scheduled appropriately in the near future. Obviously with any new or worsening symptomatology including gross hematuria, changes in voiding habits from baseline, increased unilateral pain/discomfort or correlating signs/symptoms of systemic infection he will follow up appropriately. Also he will let us know if he passes stone in the interval. Otherwise once I hear back from his urologist, he will be scheduled appropriately. Precautionary urine culture sent today to serve as baseline testing.   After a thorough review of the management options for the patient's condition the patient  elected to proceed with surgical therapy as noted above. We have discussed the potential benefits and risks of the procedure, side effects of the proposed treatment, the likelihood of the patient achieving the goals of the procedure, and any potential problems that might occur during the  procedure or recuperation. Informed consent has been obtained.

## 2020-09-04 ENCOUNTER — Ambulatory Visit (HOSPITAL_BASED_OUTPATIENT_CLINIC_OR_DEPARTMENT_OTHER)
Admission: RE | Admit: 2020-09-04 | Discharge: 2020-09-04 | Disposition: A | Discharge status: Home / Self Care | Payer: Medicare Other | Attending: Urology | Admitting: Urology

## 2020-09-04 ENCOUNTER — Ambulatory Visit (HOSPITAL_COMMUNITY): Payer: Medicare Other

## 2020-09-04 ENCOUNTER — Encounter (HOSPITAL_BASED_OUTPATIENT_CLINIC_OR_DEPARTMENT_OTHER)
Admission: RE | Disposition: A | Discharge status: Home / Self Care | Payer: Self-pay | Source: Home / Self Care | Attending: Urology

## 2020-09-04 ENCOUNTER — Encounter (HOSPITAL_BASED_OUTPATIENT_CLINIC_OR_DEPARTMENT_OTHER): Payer: Self-pay | Admitting: Urology

## 2020-09-04 DIAGNOSIS — Z87891 Personal history of nicotine dependence: Secondary | ICD-10-CM | POA: Diagnosis not present

## 2020-09-04 DIAGNOSIS — N189 Chronic kidney disease, unspecified: Secondary | ICD-10-CM | POA: Insufficient documentation

## 2020-09-04 DIAGNOSIS — I129 Hypertensive chronic kidney disease with stage 1 through stage 4 chronic kidney disease, or unspecified chronic kidney disease: Secondary | ICD-10-CM | POA: Insufficient documentation

## 2020-09-04 DIAGNOSIS — Z880 Allergy status to penicillin: Secondary | ICD-10-CM | POA: Diagnosis not present

## 2020-09-04 DIAGNOSIS — Z8546 Personal history of malignant neoplasm of prostate: Secondary | ICD-10-CM | POA: Insufficient documentation

## 2020-09-04 DIAGNOSIS — N201 Calculus of ureter: Secondary | ICD-10-CM | POA: Diagnosis present

## 2020-09-04 HISTORY — DX: Other specified postprocedural states: R11.2

## 2020-09-04 HISTORY — DX: Other specified postprocedural states: Z98.890

## 2020-09-04 HISTORY — PX: EXTRACORPOREAL SHOCK WAVE LITHOTRIPSY: SHX1557

## 2020-09-04 SURGERY — LITHOTRIPSY, ESWL
Anesthesia: LOCAL | Laterality: Left

## 2020-09-04 MED ORDER — DIPHENHYDRAMINE HCL 25 MG PO CAPS
25.0000 mg | ORAL_CAPSULE | ORAL | Status: AC
  Administered 2020-09-04: 25 mg via ORAL

## 2020-09-04 MED ORDER — DIAZEPAM 5 MG PO TABS
10.0000 mg | ORAL_TABLET | ORAL | Status: AC
  Administered 2020-09-04: 10 mg via ORAL

## 2020-09-04 MED ORDER — DIPHENHYDRAMINE HCL 25 MG PO CAPS
ORAL_CAPSULE | ORAL | Status: AC
  Filled 2020-09-04: qty 1

## 2020-09-04 MED ORDER — DIAZEPAM 5 MG PO TABS
ORAL_TABLET | ORAL | Status: AC
  Filled 2020-09-04: qty 2

## 2020-09-04 MED ORDER — CIPROFLOXACIN HCL 500 MG PO TABS
ORAL_TABLET | ORAL | Status: AC
  Filled 2020-09-04: qty 1

## 2020-09-04 MED ORDER — SODIUM CHLORIDE 0.9 % IV SOLN: INTRAVENOUS | Status: DC

## 2020-09-04 MED ORDER — CIPROFLOXACIN HCL 500 MG PO TABS
500.0000 mg | ORAL_TABLET | ORAL | Status: AC
  Administered 2020-09-04: 500 mg via ORAL

## 2020-09-04 NOTE — Interval H&P Note (Signed)
History and Physical Interval Note:  09/04/2020 3:09 PM  Christopher Duran  has presented today for surgery, with the diagnosis of LEFT DISTAL URETERAL STONE.  The various methods of treatment have been discussed with the patient and family. After consideration of risks, benefits and other options for treatment, the patient has consented to  Procedure(s): LEFT EXTRACORPOREAL SHOCK WAVE LITHOTRIPSY (ESWL) (Left) as a surgical intervention.  The patient's history has been reviewed, patient examined, no change in status, stable for surgery.  I have reviewed the patient's chart and labs.  Questions were answered to the patient's satisfaction.     [name] A Seiji Wiswell

## 2020-09-04 NOTE — Discharge Instructions (Signed)
I have reviewed discharge instructions in detail with the patient. They will follow-up with me or their physician as scheduled. My nurse will also be calling the patients as per protocol.   PAIN MEDS SENT FROM MY OFFICE   Post Anesthesia Home Care Instructions  Activity: Get plenty of rest for the remainder of the day. A responsible individual must stay with you for 24 hours following the procedure.  For the next 24 hours, DO NOT: -Drive a car -Paediatric nurse -Drink alcoholic beverages -Take any medication unless instructed by your physician -Make any legal decisions or sign important papers.  Meals: Start with liquid foods such as gelatin or soup. Progress to regular foods as tolerated. Avoid greasy, spicy, heavy foods. If nausea and/or vomiting occur, drink only clear liquids until the nausea and/or vomiting subsides. Call your physician if vomiting continues.

## 2020-09-05 ENCOUNTER — Encounter (HOSPITAL_BASED_OUTPATIENT_CLINIC_OR_DEPARTMENT_OTHER): Payer: Self-pay | Admitting: Urology

## 2021-02-15 IMAGING — MR MR PROSTATE WO/W CM
56 series · 56 of 56 positions shown · IV contrast (19ml Multihance)
Comparison: 07/24/2018 biopsy results.

CLINICAL DATA: Prostate cancer.

EXAM:
MR PROSTATE WITHOUT AND WITH CONTRAST
TECHNIQUE: Multiplanar multisequence MRI images were obtained of the pelvis
centered about the prostate. Pre and post contrast images were
obtained.
CONTRAST:  19mL MULTIHANCE GADOBENATE DIMEGLUMINE 529 MG/ML IV SOLN

[Series 3: bSSFP fat-sat · axial · 8.0mm · 0.74mm/px · 1 of 28 slices shown]
[im 1/28]
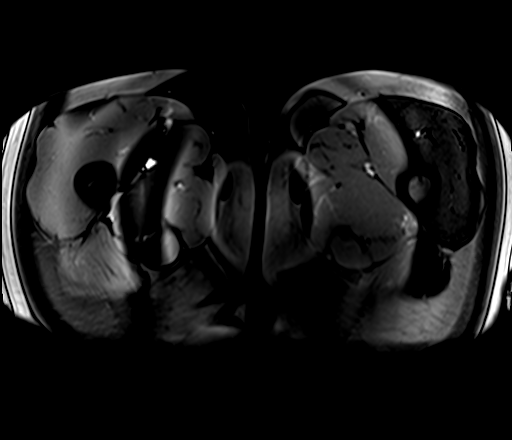

[Series 4: T1 · axial · 8.0mm · 1.06mm/px · 1 of 28 slices shown (1 of 2)]
[im 1/28]
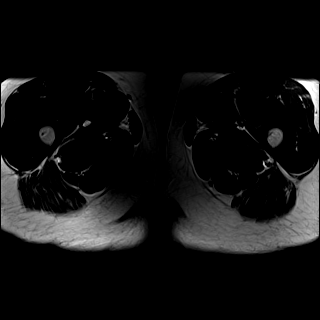

[Series 5: T2 · sagittal · 3.5mm · 0.56mm/px · 1 of 39 slices shown (1 of 4)]
[im 1/39]
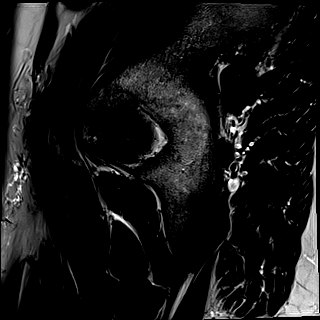

[Series 6: T2 · coronal · 3.5mm · 0.56mm/px · 1 of 23 slices shown (2 of 4)]
[im 1/23]
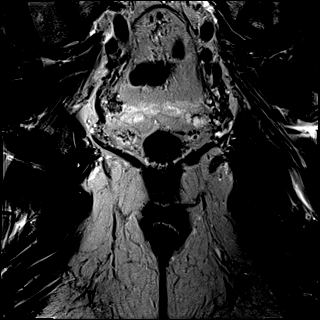

[Series 7: DWI · axial · 3.5mm · 1.56mm/px · 1 of 66 slices shown (1 of 2)]
[im 1/66]
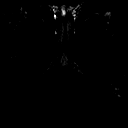

[Series 8: DWI · axial · 3.5mm · 1.56mm/px · 1 of 22 slices shown (2 of 2)]
[im 1/22]
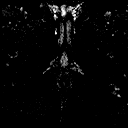

[Series 9: T1 · axial · 3.0mm · 0.31mm/px · 1 of 24 slices shown (2 of 2)]
[im 1/24]
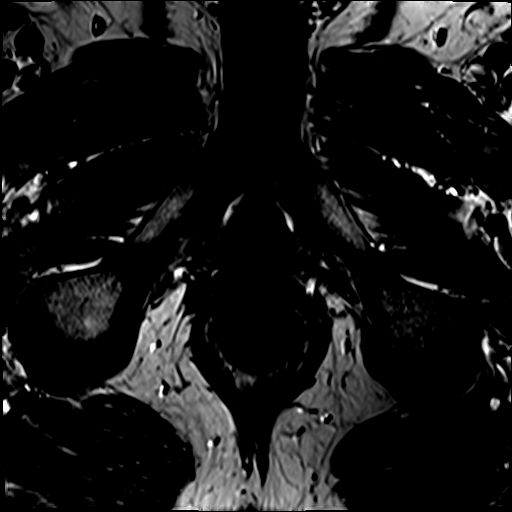

[Series 10: T2 · axial · 3.5mm · 0.56mm/px · 1 of 23 slices shown (3 of 4)]
[im 1/23]
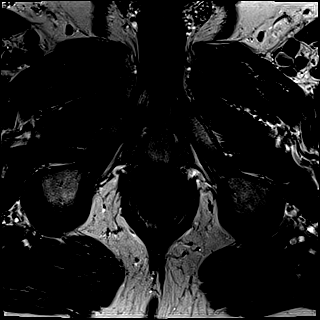

[Series 11: T2 · axial · 1.0mm · 1.04mm/px · 1 of 80 slices shown (4 of 4)]
[im 1/80]
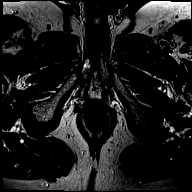

[Series 12: pre t1_twist_tra_dyn_ttc=5.8s · axial · non-contrast · 3.5mm · 0.83mm/px · 1 of 22 slices shown]
[im 1/22]
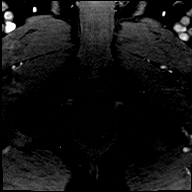

[Series 13: post t1_twist_tra_dyn-copy center · axial · 3.5mm · 0.83mm/px · 1 of 22 slices shown (1 of 24)]
[im 1/22]
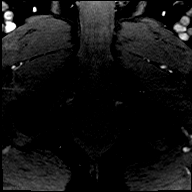

[Series 14: post t1_twist_tra_dyn-copy center · axial · 3.5mm · 0.83mm/px · 1 of 22 slices shown (2 of 24)]
[im 1/22]
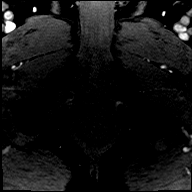

[Series 15: post t1_twist_tra_dyn-copy cent_sub_ttc=(id) · axial · 3.5mm · 0.83mm/px · 1 of 22 slices shown (1 of 22)]
[im 1/22]
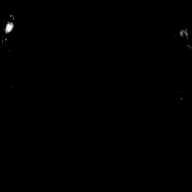

[Series 16: post t1_twist_tra_dyn-copy center · axial · 3.5mm · 0.83mm/px · 1 of 22 slices shown (3 of 24)]
[im 1/22]
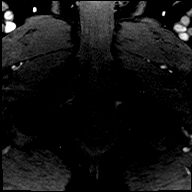

[Series 17: post t1_twist_tra_dyn-copy cent_sub_ttc=(id) · axial · 3.5mm · 0.83mm/px · 1 of 22 slices shown (2 of 22)]
[im 1/22]
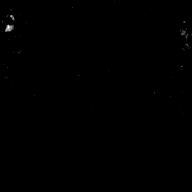

[Series 18: post t1_twist_tra_dyn-copy center · axial · 3.5mm · 0.83mm/px · 1 of 22 slices shown (4 of 24)]
[im 1/22]
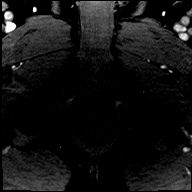

[Series 19: post t1_twist_tra_dyn-copy cent_sub_ttc=(id) · axial · 3.5mm · 0.83mm/px · 1 of 22 slices shown (3 of 22)]
[im 1/22]
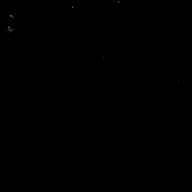

[Series 20: post t1_twist_tra_dyn-copy center · axial · 3.5mm · 0.83mm/px · 1 of 22 slices shown (5 of 24)]
[im 1/22]
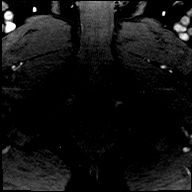

[Series 21: post t1_twist_tra_dyn-copy cent_sub_ttc=(id) · axial · 3.5mm · 0.83mm/px · 1 of 22 slices shown (4 of 22)]
[im 1/22]
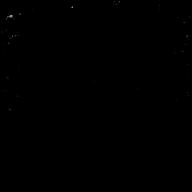

[Series 22: post t1_twist_tra_dyn-copy center · axial · 3.5mm · 0.83mm/px · 1 of 22 slices shown (6 of 24)]
[im 1/22]
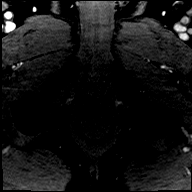

[Series 23: post t1_twist_tra_dyn-copy cent_sub_ttc=(id) · axial · 3.5mm · 0.83mm/px · 1 of 22 slices shown (5 of 22)]
[im 1/22]
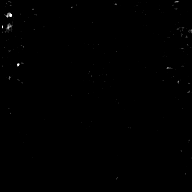

[Series 24: post t1_twist_tra_dyn-copy center · axial · 3.5mm · 0.83mm/px · 1 of 22 slices shown (7 of 24)]
[im 1/22]
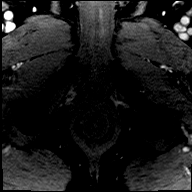

[Series 25: post t1_twist_tra_dyn-copy cent_sub_ttc=(id) · axial · 3.5mm · 0.83mm/px · 1 of 22 slices shown (6 of 22)]
[im 1/22]
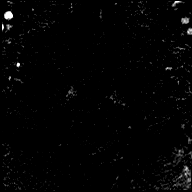

[Series 26: post t1_twist_tra_dyn-copy center · axial · 3.5mm · 0.83mm/px · 1 of 22 slices shown (8 of 24)]
[im 1/22]
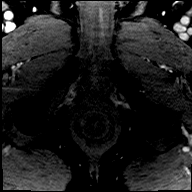

[Series 27: post t1_twist_tra_dyn-copy cent_sub_ttc=(id) · axial · 3.5mm · 0.83mm/px · 1 of 22 slices shown (7 of 22)]
[im 1/22]
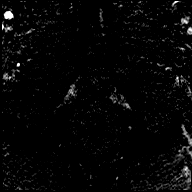

[Series 28: post t1_twist_tra_dyn-copy center · axial · 3.5mm · 0.83mm/px · 1 of 22 slices shown (9 of 24)]
[im 1/22]
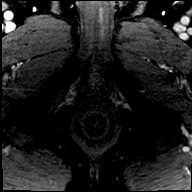

[Series 29: post t1_twist_tra_dyn-copy cent_sub_ttc=(id) · axial · 3.5mm · 0.83mm/px · 1 of 22 slices shown (8 of 22)]
[im 1/22]
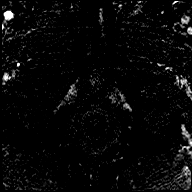

[Series 30: post t1_twist_tra_dyn-copy center · axial · 3.5mm · 0.83mm/px · 1 of 22 slices shown (10 of 24)]
[im 1/22]
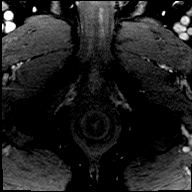

[Series 31: post t1_twist_tra_dyn-copy cent_sub_ttc=(id) · axial · 3.5mm · 0.83mm/px · 1 of 22 slices shown (9 of 22)]
[im 1/22]
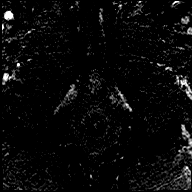

[Series 32: post t1_twist_tra_dyn-copy center · axial · 3.5mm · 0.83mm/px · 1 of 22 slices shown (11 of 24)]
[im 1/22]
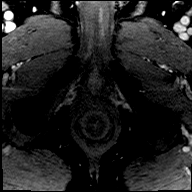

[Series 33: post t1_twist_tra_dyn-copy cent_sub_ttc=(id) · axial · 3.5mm · 0.83mm/px · 1 of 22 slices shown (10 of 22)]
[im 1/22]
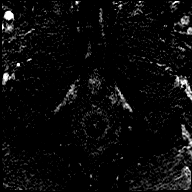

[Series 34: post t1_twist_tra_dyn-copy center · axial · 3.5mm · 0.83mm/px · 1 of 22 slices shown (12 of 24)]
[im 1/22]
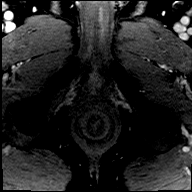

[Series 35: post t1_twist_tra_dyn-copy cent_sub_ttc=(id) · axial · 3.5mm · 0.83mm/px · 1 of 22 slices shown (11 of 22)]
[im 1/22]
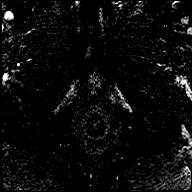

[Series 36: post t1_twist_tra_dyn-copy center · axial · 3.5mm · 0.83mm/px · 1 of 22 slices shown (13 of 24)]
[im 1/22]
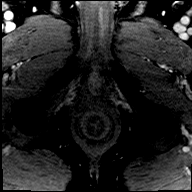

[Series 37: post t1_twist_tra_dyn-copy cent_sub_ttc=(id) · axial · 3.5mm · 0.83mm/px · 1 of 22 slices shown (12 of 22)]
[im 1/22]
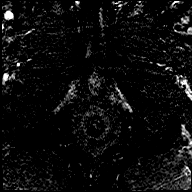

[Series 38: post t1_twist_tra_dyn-copy center · axial · 3.5mm · 0.83mm/px · 1 of 22 slices shown (14 of 24)]
[im 1/22]
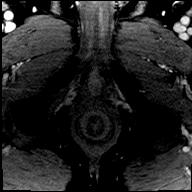

[Series 39: post t1_twist_tra_dyn-copy cent_sub_ttc=(id) · axial · 3.5mm · 0.83mm/px · 1 of 22 slices shown (13 of 22)]
[im 1/22]
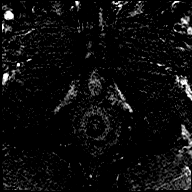

[Series 40: post t1_twist_tra_dyn-copy center · axial · 3.5mm · 0.83mm/px · 1 of 22 slices shown (15 of 24)]
[im 1/22]
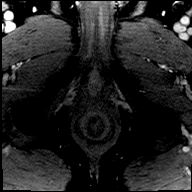

[Series 41: post t1_twist_tra_dyn-copy cent_sub_ttc=(id) · axial · 3.5mm · 0.83mm/px · 1 of 22 slices shown (14 of 22)]
[im 1/22]
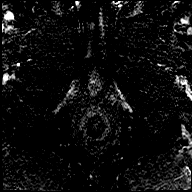

[Series 42: post t1_twist_tra_dyn-copy center · axial · 3.5mm · 0.83mm/px · 1 of 22 slices shown (16 of 24)]
[im 1/22]
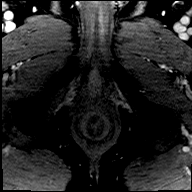

[Series 43: post t1_twist_tra_dyn-copy cent_sub_ttc=(id) · axial · 3.5mm · 0.83mm/px · 1 of 22 slices shown (15 of 22)]
[im 1/22]
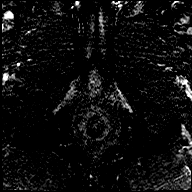

[Series 44: post t1_twist_tra_dyn-copy center · axial · 3.5mm · 0.83mm/px · 1 of 22 slices shown (17 of 24)]
[im 1/22]
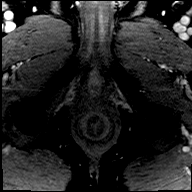

[Series 45: post t1_twist_tra_dyn-copy cent_sub_ttc=(id) · axial · 3.5mm · 0.83mm/px · 1 of 22 slices shown (16 of 22)]
[im 1/22]
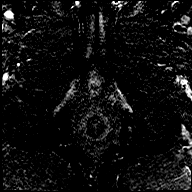

[Series 46: post t1_twist_tra_dyn-copy center · axial · 3.5mm · 0.83mm/px · 1 of 22 slices shown (18 of 24)]
[im 1/22]
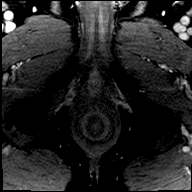

[Series 47: post t1_twist_tra_dyn-copy cent_sub_ttc=(id) · axial · 3.5mm · 0.83mm/px · 1 of 22 slices shown (17 of 22)]
[im 1/22]
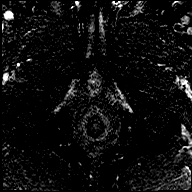

[Series 48: post t1_twist_tra_dyn-copy center · axial · 3.5mm · 0.83mm/px · 1 of 22 slices shown (19 of 24)]
[im 1/22]
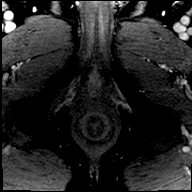

[Series 49: post t1_twist_tra_dyn-copy cent_sub_ttc=(id) · axial · 3.5mm · 0.83mm/px · 1 of 22 slices shown (18 of 22)]
[im 1/22]
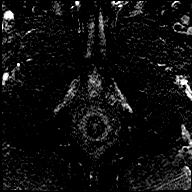

[Series 50: post t1_twist_tra_dyn-copy center · axial · 3.5mm · 0.83mm/px · 1 of 22 slices shown (20 of 24)]
[im 1/22]
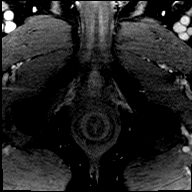

[Series 51: post t1_twist_tra_dyn-copy cent_sub_ttc=(id) · axial · 3.5mm · 0.83mm/px · 1 of 22 slices shown (19 of 22)]
[im 1/22]
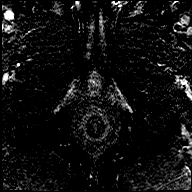

[Series 52: post t1_twist_tra_dyn-copy center · axial · 3.5mm · 0.83mm/px · 1 of 22 slices shown (21 of 24)]
[im 1/22]
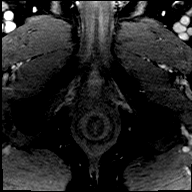

[Series 53: post t1_twist_tra_dyn-copy cent_sub_ttc=(id) · axial · 3.5mm · 0.83mm/px · 1 of 22 slices shown (20 of 22)]
[im 1/22]
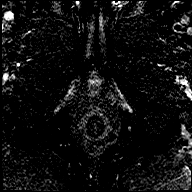

[Series 54: post t1_twist_tra_dyn-copy center · axial · 3.5mm · 0.83mm/px · 1 of 22 slices shown (22 of 24)]
[im 1/22]
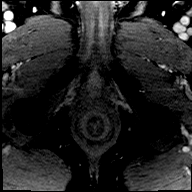

[Series 55: post t1_twist_tra_dyn-copy cent_sub_ttc=(id) · axial · 3.5mm · 0.83mm/px · 1 of 22 slices shown (21 of 22)]
[im 1/22]
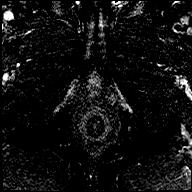

[Series 56: post t1_twist_tra_dyn-copy center · axial · 3.5mm · 0.83mm/px · 1 of 22 slices shown (23 of 24)]
[im 1/22]
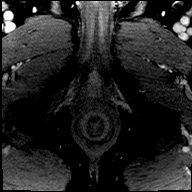

[Series 57: post t1_twist_tra_dyn-copy cent_sub_ttc=(id) · axial · 3.5mm · 0.83mm/px · 1 of 22 slices shown (22 of 22)]
[im 1/22]
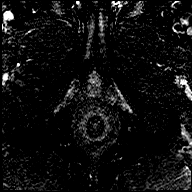

[Series 58: post t1_twist_tra_dyn-copy center · axial · 3.5mm · 0.83mm/px · 1 of 22 slices shown (24 of 24)]
[im 1/22]
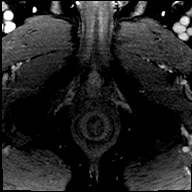

[56 of 56 positions shown; findings below may reference images not displayed]

FINDINGS: Prostate: Demonstrates moderate central gland enlargement and
heterogeneity, consistent with benign prostatic hyperplasia. No
dominant central gland nodule.

There is relatively diffuse mild T1 hyperintensity throughout the
peripheral zone, likely related to post biopsy hemorrhage.
Heterogeneous T2 signal throughout the right mid gland is at least
partially felt to be biopsy related, including on [DATE]. There is an
approximately 1.3 cm area of medial right mid gland T2 hypointensity
which may correspond to restricted diffusion (image [DATE] and [DATE]).
PI-RADS(v2.1)-3.

The most focal T2 abnormality is diminutive, at 4 mm within the
lateral left apex on [DATE], [DATE], and [DATE]. This may correspond to
restricted diffusion on [DATE]. PI-RADS(v2.1)-3.

There is relatively diffuse mild postcontrast enhancement throughout
the peripheral zone, limiting evaluation for focal tumor related
hypervascularity. Example series 25.

Volume: 5.2 x 4.9 x 4.3 cm (volume = 57 cm^3)

Transcapsular spread:  Absent

Seminal vesicle involvement: Absent

Neurovascular bundle involvement: Absent

Pelvic adenopathy: Absent

Bone metastasis: Cortical thickening including within the left iliac
on [DATE] is consistent with Paget's disease, as on 02/25/2012 CT.

Other findings: Normal urinary bladder.  No significant free fluid.
IMPRESSION: 1. Mild degradation secondary to presumably biopsy related
hemorrhage.
2. Medial right mid and lateral left apical peripheral zone signal
abnormalities which are equivocal. PI-RADS(v2.1)-3.
3. No evidence of locally advanced or pelvic metastatic disease.
4. Paget's disease, as before.
5. Early post-contrast enhancement throughout the peripheral zone is
nonspecific but can be seen with prostatitis.

## 2022-04-15 ENCOUNTER — Other Ambulatory Visit: Payer: Self-pay | Admitting: Urology

## 2022-04-15 DIAGNOSIS — C61 Malignant neoplasm of prostate: Secondary | ICD-10-CM

## 2022-05-21 ENCOUNTER — Ambulatory Visit
Admission: RE | Admit: 2022-05-21 | Discharge: 2022-05-21 | Disposition: A | Discharge status: Home / Self Care | Payer: Medicare Other | Source: Ambulatory Visit | Attending: Urology | Admitting: Urology

## 2022-05-21 DIAGNOSIS — C61 Malignant neoplasm of prostate: Secondary | ICD-10-CM

## 2022-05-21 MED ORDER — GADOPICLENOL 0.5 MMOL/ML IV SOLN
9.0000 mL | Freq: Once | INTRAVENOUS | Status: AC | PRN
  Administered 2022-05-21: 9 mL via INTRAVENOUS

## 2022-11-13 ENCOUNTER — Ambulatory Visit (HOSPITAL_COMMUNITY)
Admission: EM | Admit: 2022-11-13 | Discharge: 2022-11-13 | Disposition: A | Discharge status: Home / Self Care | Payer: Medicare Other | Source: Home / Self Care

## 2022-11-13 ENCOUNTER — Encounter (HOSPITAL_COMMUNITY): Payer: Self-pay | Admitting: Emergency Medicine

## 2022-11-13 ENCOUNTER — Other Ambulatory Visit: Payer: Self-pay

## 2022-11-13 DIAGNOSIS — R220 Localized swelling, mass and lump, head: Secondary | ICD-10-CM

## 2022-11-13 MED ORDER — PREDNISONE 20 MG PO TABS: 40.0000 mg | ORAL_TABLET | Freq: Every day | ORAL | 0 refills | Status: AC

## 2022-11-13 MED ORDER — AMLODIPINE BESYLATE 10 MG PO TABS: 10.0000 mg | ORAL_TABLET | Freq: Every day | ORAL | 0 refills | Status: AC

## 2022-11-13 MED ORDER — CETIRIZINE HCL 10 MG PO TABS: 10.0000 mg | ORAL_TABLET | Freq: Every day | ORAL | 0 refills | Status: AC

## 2022-11-13 NOTE — ED Triage Notes (Addendum)
Reports noted swelling on left side of face about 2 hours ago.  States it is somewhat better now.  Patient has pictures with him. Denies ever having any tongue or throat swelling.  No dental pain.  Visible swelling on left side of mouth/cheek.    Did not take any medicines for symptoms and had not taken any morning medicines

## 2022-11-13 NOTE — Discharge Instructions (Signed)
I am not exactly sure what is causing your symptoms.  I do not see evidence of a dental infection but if you develop any dental pain or swelling you need to be seen again.  It is possible that your ramipril is contributing to your symptoms.  Please stop this and start amlodipine which is a different class of medication.  Follow-up with your primary care first thing next week.  We are also going to treat as if there is an allergic component.  Start cetirizine daily.  Take prednisone 40 mg for 3 days.  Do not take NSAIDs with this medication including aspirin, ibuprofen/Advil, naproxen/Aleve.  If you have any worsening or changing symptoms including additional swelling, swelling of your throat, muffled voice, shortness of breath, nausea/vomiting, hives, rash you need to be seen immediately in the emergency room.

## 2022-11-13 NOTE — ED Provider Notes (Signed)
[facility]    CSN: [id number] Arrival date & time: 11/13/22  1005      History   Chief Complaint Chief Complaint  Patient presents with   Facial Swelling    HPI Christopher Duran is a 77 y.o. male.   Patient presents today with a several hour history of left lower jaw/facial swelling.  He denies any associated pain but reports that he was sitting on the couch watching TV when he suddenly felt a discomfort in this area and noticed that this area becomes swollen and hard.  He was not bitten or stung by any insects and denies any additional exposures to plants, insects, animals.  He reports that he has been monitoring this area for the past several hours and it has begun to improve on its own.  He denies any dental pain or swelling surrounding a tooth.  He did have a root canal/crown placed this was on the right lower side several months ago.  Denies any more recent dental procedure.  He does take ramipril but has been on this medication for many years.  Denies any additional medication changes.  He has not tried any over-the-counter medication for symptom management.  Denies history of anaphylaxis.  He denies any additional symptoms including fever, swelling of his throat, shortness of breath, cough, nausea, vomiting, rash, hives.    Past Medical History:  Diagnosis Date   BPH (benign prostatic hyperplasia)    Diarrhea    Hypertension    IBS (irritable bowel syndrome)    MVP (mitral valve prolapse)    PONV (postoperative nausea and vomiting)     Patient Active Problem List   Diagnosis Date Noted   Dyspnea 05/18/2019   Anemia 02/08/2018   Fatigue associated with anemia 02/08/2018   Chest pain 07/02/2014   HTN (hypertension) 07/02/2014    Past Surgical History:  Procedure Laterality Date   artho     shoulder b/l   EXTRACORPOREAL SHOCK WAVE LITHOTRIPSY Left 09/04/2020   Procedure: LEFT EXTRACORPOREAL SHOCK WAVE LITHOTRIPSY (ESWL);  Surgeon: Alfredo Martinez, MD;   Location: [facility];  Service: Urology;  Laterality: Left;   SHOULDER ARTHROSCOPY Bilateral        Home Medications    Prior to Admission medications   Medication Sig Start Date End Date Taking? Authorizing Provider  amLODipine (NORVASC) 10 MG tablet Take 1 tablet (10 mg total) by mouth daily. 11/13/22  Yes , Noberto Retort, PA-C  cetirizine (ZYRTEC ALLERGY) 10 MG tablet Take 1 tablet (10 mg total) by mouth at bedtime. 11/13/22  Yes ,  K, PA-C  predniSONE (DELTASONE) 20 MG tablet Take 2 tablets (40 mg total) by mouth daily for 3 days. 11/13/22 11/16/22 Yes ,  K, PA-C  albuterol (VENTOLIN HFA) 108 (90 Base) MCG/ACT inhaler Inhale 2 puffs into the lungs every 6 (six) hours as needed for wheezing or shortness of breath.    [provider]  alfuzosin (UROXATRAL) 10 MG 24 hr tablet Take 10 mg by mouth daily with breakfast.    [provider]  ciprofloxacin (CIPRO) 500 MG tablet Take 500 mg by mouth.    [provider]  hydrochlorothiazide (HYDRODIURIL) 12.5 MG tablet Take 12.5 mg by mouth daily. 05/07/19   [provider]  hydrocortisone (ANUSOL-HC) 25 MG suppository Place 25 mg rectally 2 (two) times daily as needed for hemorrhoids or anal itching.    [provider]    Family History Family History  Problem Relation Age of  Onset   Cancer Mother    CVA Father     Social History Social History   Tobacco Use   Smoking status: Former    Current packs/day: 0.00    Average packs/day: 1 pack/day for 4.0 years (4.0 ttl pk-yrs)    Types: Cigarettes    Start date: 12/14/1967    Quit date: 12/14/1971    Years since quitting: 50.9   Smokeless tobacco: Never   Tobacco comments:    4 years only  Vaping Use   Vaping status: Never Used  Substance Use Topics   Alcohol use: No    Alcohol/week: 0.0 standard drinks of alcohol   Drug use: No     Allergies   Penicillins, Tetracyclines & related, and Zithromax  [azithromycin]   Review of Systems Review of Systems  Constitutional:  Positive for activity change. Negative for appetite change, fatigue and fever.  HENT:  Positive for facial swelling. Negative for congestion, sinus pressure, sneezing, sore throat, trouble swallowing and voice change.   Respiratory:  Negative for cough and shortness of breath.   Cardiovascular:  Negative for chest pain.  Gastrointestinal:  Negative for abdominal pain, diarrhea, nausea and vomiting.     Physical Exam Triage Vital Signs ED Triage Vitals  Encounter Vitals Group     BP 11/13/22 1034 (!) 157/79     Systolic BP Percentile --      Diastolic BP Percentile --      Pulse Rate 11/13/22 1034 63     Resp 11/13/22 1034 20     Temp 11/13/22 1034 98.5 F (36.9 C)     Temp Source 11/13/22 1034 Oral     SpO2 11/13/22 1034 97 %     Weight --      Height --      Head Circumference --      Peak Flow --      Pain Score 11/13/22 1032 0     Pain Loc --      Pain Education --      Exclude from Growth Chart --    No data found.  Updated Vital Signs BP (!) 165/73 (BP Location: Left Arm) Comment: repositioned  Pulse 63   Temp 98.5 F (36.9 C) (Oral)   Resp 20   SpO2 97%   Visual Acuity Right Eye Distance:   Left Eye Distance:   Bilateral Distance:    Right Eye Near:   Left Eye Near:    Bilateral Near:     Physical Exam Vitals reviewed.  Constitutional:      General: He is awake.     Appearance: Normal appearance. He is well-developed. He is not ill-appearing.     Comments: Very pleasant male appears stated age in no acute distress sitting comfortably in exam room  HENT:     Head: Normocephalic and atraumatic.     Jaw: Swelling present.      Right Ear: Tympanic membrane, ear canal and external ear normal. Tympanic membrane is not erythematous or bulging.     Left Ear: Tympanic membrane, ear canal and external ear normal. Tympanic membrane is not erythematous or bulging.     Nose: Nose normal.      Mouth/Throat:     Dentition: Normal dentition. No dental tenderness, gingival swelling or dental abscesses.     Pharynx: Uvula midline. No oropharyngeal exudate, posterior oropharyngeal erythema or uvula swelling.     Comments: No pain percussion of teeth.  No evidence of abscess.  No evidence of Ludwig angina.  No swelling involving throat or mouth. Cardiovascular:     Rate and Rhythm: Normal rate and regular rhythm.     Heart sounds: Normal heart sounds, S1 normal and S2 normal. No murmur heard. Pulmonary:     Effort: Pulmonary effort is normal. No accessory muscle usage or respiratory distress.     Breath sounds: Normal breath sounds. No stridor. No wheezing, rhonchi or rales.     Comments: Clear to auscultation bilaterally Abdominal:     General: Bowel sounds are normal.     Palpations: Abdomen is soft.     Tenderness: There is no abdominal tenderness.  Neurological:     Mental Status: He is alert.  Psychiatric:        Behavior: Behavior is cooperative.      UC Treatments / Results  Labs (all labs ordered are listed, but only abnormal results are displayed) Labs Reviewed - No data to display  EKG   Radiology No results found.  Procedures Procedures (including critical care time)  Medications Ordered in UC Medications - No data to display  Initial Impression / Assessment and Plan / UC Course  I have reviewed the triage vital signs and the nursing notes.  Pertinent labs & imaging results that were available during my care of the patient were reviewed by me and considered in my medical decision making (see chart for details).     Patient is well-appearing, afebrile, nontoxic, nontachycardic.  Unclear etiology of symptoms.  No obvious dental abscess on exam.  We did discuss that if he has increasing swelling or develops any dentalgia he should return and we can consider antibiotics.  Low suspicion for angioedema but patient does take an ACE inhibitor.  Given his  symptoms did occur suddenly and involve a portion of his lip/mouth we will discontinue this and exchange for amlodipine.  Recommend he follow-up closely with his primary care.  Will also treat for allergic etiology with cetirizine and 40 mg of prednisone for 3 days.  He was instructed not to take NSAIDs with prednisone due to risk of GI bleeding.  We did discuss that if there is concern for angioedema these will be ineffective and if he has any worsening symptoms including additional swelling, swelling involving his throat, shortness of breath, fever, hives, nausea, vomiting he needs to go to the emergency room.  Strict return precautions given.  Final Clinical Impressions(s) / UC Diagnoses   Final diagnoses:  Facial swelling     Discharge Instructions      I am not exactly sure what is causing your symptoms.  I do not see evidence of a dental infection but if you develop any dental pain or swelling you need to be seen again.  It is possible that your ramipril is contributing to your symptoms.  Please stop this and start amlodipine which is a different class of medication.  Follow-up with your primary care first thing next week.  We are also going to treat as if there is an allergic component.  Start cetirizine daily.  Take prednisone 40 mg for 3 days.  Do not take NSAIDs with this medication including aspirin, ibuprofen/Advil, naproxen/Aleve.  If you have any worsening or changing symptoms including additional swelling, swelling of your throat, muffled voice, shortness of breath, nausea/vomiting, hives, rash you need to be seen immediately in the emergency room.     ED Prescriptions     Medication Sig Dispense Auth. Provider   amLODipine (NORVASC)  10 MG tablet Take 1 tablet (10 mg total) by mouth daily. 30 tablet ,  K, PA-C   cetirizine (ZYRTEC ALLERGY) 10 MG tablet Take 1 tablet (10 mg total) by mouth at bedtime. 30 tablet ,  K, PA-C   predniSONE (DELTASONE) 20 MG tablet  Take 2 tablets (40 mg total) by mouth daily for 3 days. 6 tablet , Noberto Retort, PA-C      PDMP not reviewed this encounter.   Jeani Hawking, PA-C 11/13/22 1055

## 2023-08-26 ENCOUNTER — Other Ambulatory Visit: Payer: Self-pay | Admitting: Urology

## 2023-08-26 DIAGNOSIS — N138 Other obstructive and reflux uropathy: Secondary | ICD-10-CM

## 2023-09-13 ENCOUNTER — Ambulatory Visit
Admission: RE | Admit: 2023-09-13 | Discharge: 2023-09-13 | Disposition: A | Discharge status: Home / Self Care | Source: Ambulatory Visit | Attending: Urology | Admitting: Urology

## 2023-09-13 DIAGNOSIS — N138 Other obstructive and reflux uropathy: Secondary | ICD-10-CM

## 2023-09-13 MED ORDER — GADOPICLENOL 0.5 MMOL/ML IV SOLN
10.0000 mL | Freq: Once | INTRAVENOUS | Status: AC | PRN
  Administered 2023-09-13: 10 mL via INTRAVENOUS

## 2023-09-28 ENCOUNTER — Other Ambulatory Visit: Payer: Self-pay | Admitting: Urology

## 2023-09-28 DIAGNOSIS — R972 Elevated prostate specific antigen [PSA]: Secondary | ICD-10-CM

## 2023-09-28 MED ORDER — DIAZEPAM 10 MG PO TABS: ORAL_TABLET | ORAL | 0 refills | Status: AC

## 2023-12-08 ENCOUNTER — Encounter (HOSPITAL_BASED_OUTPATIENT_CLINIC_OR_DEPARTMENT_OTHER): Payer: Self-pay | Admitting: Surgery

## 2023-12-12 ENCOUNTER — Encounter (HOSPITAL_BASED_OUTPATIENT_CLINIC_OR_DEPARTMENT_OTHER)
Admission: RE | Admit: 2023-12-12 | Discharge: 2023-12-12 | Disposition: A | Discharge status: Home / Self Care | Source: Ambulatory Visit | Attending: Surgery | Admitting: Surgery

## 2023-12-12 DIAGNOSIS — Z01812 Encounter for preprocedural laboratory examination: Secondary | ICD-10-CM | POA: Diagnosis present

## 2023-12-12 LAB — BASIC METABOLIC PANEL WITH GFR
Anion gap: 10 (ref 5–15)
BUN: 19 mg/dL (ref 8–23)
CO2: 23 mmol/L (ref 22–32)
Calcium: 9.3 mg/dL (ref 8.9–10.3)
Chloride: 103 mmol/L (ref 98–111)
Creatinine, Ser: 1.15 mg/dL (ref 0.61–1.24)
GFR, Estimated: >60 mL/min (ref >60)
Glucose, Bld: 98 mg/dL (ref 70–99)
Potassium: 5.1 mmol/L (ref 3.5–5.1)
Sodium: 136 mmol/L (ref 135–145)

## 2023-12-15 ENCOUNTER — Ambulatory Visit: Payer: Self-pay | Admitting: Surgery

## 2023-12-15 NOTE — Anesthesia Preprocedure Evaluation (Signed)
 Anesthesia Evaluation  Patient identified by MRN, date of birth, ID band Patient awake    Reviewed: Allergy & Precautions, NPO status , Patient's Chart, lab work & pertinent test results  History of Anesthesia Complications (+) PONV and history of anesthetic complications  Airway Mallampati: II  TM Distance: >3 FB Neck ROM: Full    Dental no notable dental hx. (+) Teeth Intact   Pulmonary asthma , former smoker   Pulmonary exam normal breath sounds clear to auscultation       Cardiovascular hypertension, Pt. on medications (-) angina (-) Past MI Normal cardiovascular exam Rhythm:Regular Rate:Normal     Neuro/Psych negative neurological ROS  negative psych ROS   GI/Hepatic Neg liver ROS,GERD  ,,IBS   Endo/Other  negative endocrine ROS    Renal/GU      Musculoskeletal  (+) Arthritis ,    Abdominal   Peds  Hematology   Anesthesia Other Findings All: pcn, tetracyclines, zithromycin  Reproductive/Obstetrics                              Anesthesia Physical Anesthesia Plan  ASA: 2  Anesthesia Plan: General   Post-op Pain Management: Tylenol  PO (pre-op)* and Precedex    Induction: Intravenous  PONV Risk Score and Plan: 4 or greater and Treatment may vary due to age or medical condition, Midazolam, Ondansetron , Dexamethasone , Propofol  infusion and TIVA  Airway Management Planned: Oral ETT  Additional Equipment: None  Intra-op Plan:   Post-operative Plan: Extubation in OR  Informed Consent: I have reviewed the patients History and Physical, chart, labs and discussed the procedure including the risks, benefits and alternatives for the proposed anesthesia with the patient or authorized representative who has indicated his/her understanding and acceptance.     Dental advisory given  Plan Discussed with: CRNA and Surgeon  Anesthesia Plan Comments:          Anesthesia Quick  Evaluation

## 2023-12-16 ENCOUNTER — Encounter (HOSPITAL_BASED_OUTPATIENT_CLINIC_OR_DEPARTMENT_OTHER): Admitting: Anesthesiology

## 2023-12-16 ENCOUNTER — Other Ambulatory Visit: Payer: Self-pay

## 2023-12-16 ENCOUNTER — Encounter (HOSPITAL_BASED_OUTPATIENT_CLINIC_OR_DEPARTMENT_OTHER): Payer: Self-pay | Admitting: Surgery

## 2023-12-16 ENCOUNTER — Ambulatory Visit (HOSPITAL_BASED_OUTPATIENT_CLINIC_OR_DEPARTMENT_OTHER)
Admission: RE | Admit: 2023-12-16 | Discharge: 2023-12-16 | Disposition: A | Discharge status: Home / Self Care | Attending: Surgery | Admitting: Surgery

## 2023-12-16 ENCOUNTER — Encounter (HOSPITAL_BASED_OUTPATIENT_CLINIC_OR_DEPARTMENT_OTHER)
Admission: RE | Disposition: A | Discharge status: Home / Self Care | Payer: Self-pay | Source: Home / Self Care | Attending: Surgery

## 2023-12-16 ENCOUNTER — Ambulatory Visit (HOSPITAL_BASED_OUTPATIENT_CLINIC_OR_DEPARTMENT_OTHER): Admitting: Anesthesiology

## 2023-12-16 DIAGNOSIS — K580 Irritable bowel syndrome with diarrhea: Secondary | ICD-10-CM | POA: Diagnosis not present

## 2023-12-16 DIAGNOSIS — Z87891 Personal history of nicotine dependence: Secondary | ICD-10-CM | POA: Insufficient documentation

## 2023-12-16 DIAGNOSIS — M1990 Unspecified osteoarthritis, unspecified site: Secondary | ICD-10-CM | POA: Insufficient documentation

## 2023-12-16 DIAGNOSIS — K219 Gastro-esophageal reflux disease without esophagitis: Secondary | ICD-10-CM | POA: Insufficient documentation

## 2023-12-16 DIAGNOSIS — J45909 Unspecified asthma, uncomplicated: Secondary | ICD-10-CM | POA: Insufficient documentation

## 2023-12-16 DIAGNOSIS — I10 Essential (primary) hypertension: Secondary | ICD-10-CM | POA: Insufficient documentation

## 2023-12-16 DIAGNOSIS — K648 Other hemorrhoids: Secondary | ICD-10-CM | POA: Insufficient documentation

## 2023-12-16 HISTORY — PX: RECTAL EXAM UNDER ANESTHESIA: SHX6399

## 2023-12-16 HISTORY — PX: HEMORRHOID SURGERY: SHX153

## 2023-12-16 SURGERY — HEMORRHOIDECTOMY
Anesthesia: General | Site: Rectum

## 2023-12-16 MED ORDER — ROCURONIUM BROMIDE 10 MG/ML (PF) SYRINGE
PREFILLED_SYRINGE | INTRAVENOUS | Status: AC
  Filled 2023-12-16: qty 10

## 2023-12-16 MED ORDER — ROCURONIUM BROMIDE 10 MG/ML (PF) SYRINGE
PREFILLED_SYRINGE | INTRAVENOUS | Status: DC | PRN
  Administered 2023-12-16: 50 mg via INTRAVENOUS

## 2023-12-16 MED ORDER — LIDOCAINE 2% (20 MG/ML) 5 ML SYRINGE
INTRAMUSCULAR | Status: AC
  Filled 2023-12-16: qty 5

## 2023-12-16 MED ORDER — DEXMEDETOMIDINE HCL IN NACL 80 MCG/20ML IV SOLN
INTRAVENOUS | Status: DC | PRN
  Administered 2023-12-16: 8 ug via INTRAVENOUS

## 2023-12-16 MED ORDER — SUGAMMADEX SODIUM 200 MG/2ML IV SOLN
INTRAVENOUS | Status: DC | PRN
  Administered 2023-12-16: 200 mg via INTRAVENOUS

## 2023-12-16 MED ORDER — PROPOFOL 500 MG/50ML IV EMUL
INTRAVENOUS | Status: DC | PRN
  Administered 2023-12-16: 150 ug/kg/min via INTRAVENOUS

## 2023-12-16 MED ORDER — PROPOFOL 10 MG/ML IV BOLUS
INTRAVENOUS | Status: DC | PRN
  Administered 2023-12-16: 120 mg via INTRAVENOUS

## 2023-12-16 MED ORDER — FENTANYL CITRATE (PF) 100 MCG/2ML IJ SOLN
INTRAMUSCULAR | Status: DC | PRN
  Administered 2023-12-16 (×2): 50 ug via INTRAVENOUS

## 2023-12-16 MED ORDER — EPHEDRINE SULFATE-NACL 50-0.9 MG/10ML-% IV SOSY
PREFILLED_SYRINGE | INTRAVENOUS | Status: DC | PRN
  Administered 2023-12-16 (×2): 10 mg via INTRAVENOUS

## 2023-12-16 MED ORDER — ACETAMINOPHEN 500 MG PO TABS
1000.0000 mg | ORAL_TABLET | ORAL | Status: AC
  Administered 2023-12-16: 1000 mg via ORAL

## 2023-12-16 MED ORDER — BUPIVACAINE-EPINEPHRINE 0.25% -1:200000 IJ SOLN
INTRAMUSCULAR | Status: DC | PRN
  Administered 2023-12-16: 50 mL

## 2023-12-16 MED ORDER — LACTATED RINGERS IV SOLN: INTRAVENOUS | Status: DC

## 2023-12-16 MED ORDER — CHLORHEXIDINE GLUCONATE CLOTH 2 % EX PADS
6.0000 | MEDICATED_PAD | Freq: Once | CUTANEOUS | Status: DC
Start: 2023-12-16 — End: 2023-12-16

## 2023-12-16 MED ORDER — FLEET ENEMA RE ENEM
ENEMA | RECTAL | Status: AC
  Filled 2023-12-16: qty 1

## 2023-12-16 MED ORDER — PHENYLEPHRINE 80 MCG/ML (10ML) SYRINGE FOR IV PUSH (FOR BLOOD PRESSURE SUPPORT)
PREFILLED_SYRINGE | INTRAVENOUS | Status: AC
  Filled 2023-12-16: qty 10

## 2023-12-16 MED ORDER — PHENYLEPHRINE 80 MCG/ML (10ML) SYRINGE FOR IV PUSH (FOR BLOOD PRESSURE SUPPORT)
PREFILLED_SYRINGE | INTRAVENOUS | Status: DC | PRN
  Administered 2023-12-16: 160 ug via INTRAVENOUS

## 2023-12-16 MED ORDER — DEXAMETHASONE SODIUM PHOSPHATE 10 MG/ML IJ SOLN
INTRAMUSCULAR | Status: AC
  Filled 2023-12-16: qty 1

## 2023-12-16 MED ORDER — ACETAMINOPHEN 500 MG PO TABS
ORAL_TABLET | ORAL | Status: AC
  Filled 2023-12-16: qty 2

## 2023-12-16 MED ORDER — ONDANSETRON HCL 4 MG/2ML IJ SOLN
INTRAMUSCULAR | Status: DC | PRN
  Administered 2023-12-16: 4 mg via INTRAVENOUS

## 2023-12-16 MED ORDER — LIDOCAINE 2% (20 MG/ML) 5 ML SYRINGE
INTRAMUSCULAR | Status: DC | PRN
  Administered 2023-12-16: 80 mg via INTRAVENOUS

## 2023-12-16 MED ORDER — 0.9 % SODIUM CHLORIDE (POUR BTL) OPTIME
TOPICAL | Status: DC | PRN
  Administered 2023-12-16: 1000 mL

## 2023-12-16 MED ORDER — FLEET ENEMA RE ENEM
1.0000 | ENEMA | Freq: Once | RECTAL | Status: AC
  Administered 2023-12-16: 1 via RECTAL

## 2023-12-16 MED ORDER — OXYCODONE HCL 5 MG PO TABS: 5.0000 mg | ORAL_TABLET | Freq: Four times a day (QID) | ORAL | 0 refills | Status: AC | PRN

## 2023-12-16 MED ORDER — EPHEDRINE 5 MG/ML INJ
INTRAVENOUS | Status: AC
  Filled 2023-12-16: qty 5

## 2023-12-16 MED ORDER — CHLORHEXIDINE GLUCONATE CLOTH 2 % EX PADS: 6.0000 | MEDICATED_PAD | Freq: Once | CUTANEOUS | Status: DC

## 2023-12-16 MED ORDER — ONDANSETRON HCL 4 MG/2ML IJ SOLN
INTRAMUSCULAR | Status: AC
  Filled 2023-12-16: qty 2

## 2023-12-16 MED ORDER — FENTANYL CITRATE (PF) 100 MCG/2ML IJ SOLN
INTRAMUSCULAR | Status: AC
  Filled 2023-12-16: qty 2

## 2023-12-16 MED ORDER — DEXAMETHASONE SODIUM PHOSPHATE 10 MG/ML IJ SOLN
INTRAMUSCULAR | Status: DC | PRN
  Administered 2023-12-16: 10 mg via INTRAVENOUS

## 2023-12-16 SURGICAL SUPPLY — 34 items
BLADE EXTENDED COATED 6.5IN (ELECTRODE) ×1 IMPLANT
BLADE SURG 15 STRL LF DISP TIS (BLADE) IMPLANT
BRIEF MESH DISP 2XL (UNDERPADS AND DIAPERS) ×1 IMPLANT
COVER BACK TABLE 60X90IN (DRAPES) ×1 IMPLANT
COVER MAYO STAND STRL (DRAPES) ×1 IMPLANT
DRAPE LAPAROTOMY 100X72 PEDS (DRAPES) ×1 IMPLANT
DRAPE UTILITY XL STRL (DRAPES) ×1 IMPLANT
ELECTRODE REM PT RTRN 9FT ADLT (ELECTROSURGICAL) ×1 IMPLANT
GAUZE 4X4 16PLY ~~LOC~~+RFID DBL (SPONGE) ×1 IMPLANT
GAUZE PAD ABD 8X10 STRL (GAUZE/BANDAGES/DRESSINGS) IMPLANT
GAUZE SPONGE 4X4 12PLY STRL (GAUZE/BANDAGES/DRESSINGS) ×1 IMPLANT
GLOVE BIO SURGEON STRL SZ7.5 (GLOVE) ×1 IMPLANT
GLOVE INDICATOR 8.0 STRL GRN (GLOVE) ×1 IMPLANT
GOWN STRL REUS W/TWL XL LVL3 (GOWN DISPOSABLE) ×1 IMPLANT
KIT SIGMOIDOSCOPE (SET/KITS/TRAYS/PACK) IMPLANT
LIGASURE 7.4 SM JAW OPEN (ELECTROSURGICAL) IMPLANT
LOOP VASCLR MAXI BLUE 18IN ST (MISCELLANEOUS) IMPLANT
NDL HYPO 22X1.5 SAFETY MO (MISCELLANEOUS) ×1 IMPLANT
NEEDLE HYPO 22X1.5 SAFETY MO (MISCELLANEOUS) ×1 IMPLANT
NS IRRIG 500ML POUR BTL (IV SOLUTION) ×1 IMPLANT
PACK BASIN DAY SURGERY FS (CUSTOM PROCEDURE TRAY) ×1 IMPLANT
PENCIL SMOKE EVACUATOR (MISCELLANEOUS) ×1 IMPLANT
SLEEVE SCD COMPRESS KNEE MED (STOCKING) ×1 IMPLANT
SPONGE SURGIFOAM ABS GEL 100 (HEMOSTASIS) IMPLANT
SPONGE SURGIFOAM ABS GEL 12-7 (HEMOSTASIS) IMPLANT
SUT VIC AB 2-0 UR6 27 (SUTURE) ×1 IMPLANT
SUT VIC AB 3-0 SH 27X BRD (SUTURE) ×1 IMPLANT
SYR BULB EAR ULCER 3OZ GRN STR (SYRINGE) IMPLANT
SYR CONTROL 10ML LL (SYRINGE) ×1 IMPLANT
TOWEL GREEN STERILE FF (TOWEL DISPOSABLE) ×1 IMPLANT
TRAY DSU PREP LF (CUSTOM PROCEDURE TRAY) ×1 IMPLANT
TUBE CONNECTING 20X1/4 (TUBING) ×1 IMPLANT
WATER STERILE IRR 500ML POUR (IV SOLUTION) IMPLANT
YANKAUER SUCT BULB TIP NO VENT (SUCTIONS) ×1 IMPLANT

## 2023-12-16 NOTE — Transfer of Care (Signed)
 Immediate Anesthesia Transfer of Care Note  Patient: Christopher Duran  Procedure(s) Performed: HEMORRHOIDECTOMY TIMES TWO (Rectum) EXAM UNDER ANESTHESIA, RECTUM (Rectum)  Patient Location: PACU  Anesthesia Type:General  Level of Consciousness: drowsy and patient cooperative  Airway & Oxygen Therapy: Patient Spontanous Breathing and Patient connected to face mask oxygen  Post-op Assessment: Report given to RN and Post -op Vital signs reviewed and stable  Post vital signs: Reviewed and stable  Last Vitals:  Vitals Value Taken Time  BP 132/54 12/16/23 11:11  Temp    Pulse 72 12/16/23 11:13  Resp 24 12/16/23 11:13  SpO2 100 % 12/16/23 11:13  Vitals shown include unfiled device data.  Last Pain:  Vitals:   12/16/23 0919  TempSrc: Temporal  PainSc: 0-No pain      Patients Stated Pain Goal: 1 (12/16/23 0919)  Complications: No notable events documented.

## 2023-12-16 NOTE — H&P (Signed)
 CC: Here today for surgery  HPI: Christopher Duran is an 78 y.o. male with history of HTN, GERD, IBS, interstitial cystitis, whom is seen in the office today as a referral by Christopher Duran for evaluation of hemorrhoids, possible anal fissure.   He has been following with [facility] for variable PSAs.  MRI prostate 09/13/2023: 1. Small PI-RADS category 4 lesion of the left posterolateral peripheral zone at the apex. Targeting data sent to [facility]. 2. Prostatomegaly and benign prostatic hypertrophy. 3. 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 4. 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  He reports a multiyear history of hemorrhoids. He describes moderate discomfort associate with bowel movements that is not typically sharp or knifelike in sensation but just more discomfort. He will occasionally have some tissue prolapse that he will reduce. Occasional bright red blood per rectum. He describes a 30-year history of IBS-D. He has fairly loose bowel movements 1-2 times daily. He denies any significant straining. He does report that it takes a while to fully evacuate everything and he will spend upwards of 1 to 2 hours on the commode. He is not currently taking any fiber supplements or stool softeners nor laxatives as these will cause his IBS to get much worse.  Concerns expressed that with his internal hemorrhoids being as large as they are that it would make prostate biopsies fairly difficult and bloody. He describes to me that prostate biopsy has not been felt to be urgent and that getting the hemorrhoids addressed first was his priority.  He denies any changes in health or health history since we met in the office. No new medications/allergies. He states he is ready for surgery today.   PMH: HTN, GERD, IBS, interstitial cystitis  PSH: He denies any prior anorectal surgeries or procedures.  FHx: Denies any known family history of colorectal, breast, endometrial or  ovarian cancer  Social Hx: Denies use of tobacco/EtOH/illicit drug. 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 He is here today by himself    Past Medical History:  Diagnosis Date   BPH (benign prostatic hyperplasia)    Diarrhea    Hypertension    IBS (irritable bowel syndrome)    MVP (mitral valve prolapse)    PONV (postoperative nausea and vomiting)     Past Surgical History:  Procedure Laterality Date   artho     shoulder b/l   EXTRACORPOREAL SHOCK WAVE LITHOTRIPSY Left 09/04/2020   Procedure: LEFT EXTRACORPOREAL SHOCK WAVE LITHOTRIPSY (ESWL);  Surgeon: Christopher Hamilton, MD;  Location: [facility];  Service: Urology;  Laterality: Left;   SHOULDER ARTHROSCOPY Bilateral     Family History  Problem Relation Age of Onset   Cancer Mother    CVA Father     Social:  reports that he quit smoking about 52 years ago. His smoking use included cigarettes. He started smoking about 56 years ago. He has a 4 pack-year smoking history. He has never used smokeless tobacco. He reports that he does not drink alcohol and does not use drugs.  Allergies:  Allergies  Allergen Reactions   Penicillins Itching and Nausea Only   Tetracyclines & Related Itching and Nausea And Vomiting   Zithromax [Azithromycin]     Medications: I have reviewed the patient's current medications.  No results found for this or any previous visit (from the past 48 hours).  No results found.   PE Height 5' 11 (1.803  m), weight 93 kg. Constitutional: NAD; conversant Eyes: Moist conjunctiva; no lid lag; anicteric Lungs: Normal respiratory effort CV: RRR Psychiatric: Appropriate affect  No results found for this or any previous visit (from the past 48 hours).  No results found.  A/P: Christopher Duran is an 78 y.o. male with hx of HTN, GERD, IBS, interstitial cystitis here for evaluation of hemorrhoids  - The anatomy and physiology of  the anal canal was discussed with him with associated pictures Psychiatrist, hemorrhoid handbook). The pathophysiology of hemorrhoids was discussed at length with associated pictures and illustrations. -From medical optimization standpoint, his stools are consistently loose and he struggles with IBS-D. He has worked to minimize time on commode is much as he can but cannot seem to get his times any better despite multiple different agents he has tried for his bowels and IBS regimens. - We have reviewed options going forward including further observation vs surgery -hemorrhoidectomy; anorectal exam under anesthesia. - The planned procedure, material risks (including, but not limited to, pain, bleeding, infection, scarring, need for blood transfusion, damage to anal sphincter, incontinence of gas and/or stool, need for additional procedures, anal stenosis, rare cases of pelvic sepsis which in severe cases may require things like a colostomy, recurrence, blood clot, pulmonary embolus, pneumonia, heart attack, stroke, death) benefits and alternatives to surgery were discussed at length. I noted a good probability that the procedure would help improve their symptoms. The patient's questions were answered to his satisfaction, the voiced understanding and elected/wishes to proceed with surgery. Additionally, we discussed typical postoperative expectations and the recovery process.  -Also discussed that hemorrhoids in and of themselves should not preclude biopsy if clinically warranted for evaluation of his prostate or to rule out any malignancy.   Christopher Pizza, MD [facility], [facility]

## 2023-12-16 NOTE — Anesthesia Postprocedure Evaluation (Signed)
 Anesthesia Post Note  Patient: Christopher Duran  Procedure(s) Performed: HEMORRHOIDECTOMY TIMES TWO (Rectum) EXAM UNDER ANESTHESIA, RECTUM (Rectum)     Patient location during evaluation: PACU Anesthesia Type: General Level of consciousness: awake and alert Pain management: pain level controlled Vital Signs Assessment: post-procedure vital signs reviewed and stable Respiratory status: spontaneous breathing, nonlabored ventilation, respiratory function stable and patient connected to nasal cannula oxygen Cardiovascular status: blood pressure returned to baseline and stable Postop Assessment: no apparent nausea or vomiting Anesthetic complications: no   No notable events documented.  Last Vitals:  Vitals:   12/16/23 1145 12/16/23 1210  BP: 139/61 (!) 146/66  Pulse: 63 60  Resp: 18 20  Temp:  (!) 36.1 C  SpO2: 97% 98%    Last Pain:  Vitals:   12/16/23 1210  TempSrc: Temporal  PainSc: 0-No pain                 Garnette DELENA Gab

## 2023-12-16 NOTE — Discharge Instructions (Addendum)
 ANORECTAL SURGERY: POST OP INSTRUCTIONS  DIET: Follow a light bland diet the first 24 hours after arrival home, such as soup, liquids, crackers, etc.  Be sure to include lots of fluids daily.  Avoid fast food or heavy meals as your are more likely to get nauseated.  Eat a low fat diet the next few days after surgery.   Some bleeding with bowel movements is expected for the first couple of days but this should stop in between bowel movements  Take your usually prescribed home medications unless otherwise directed. No foreign bodies per rectum for the next 3 months (enemas, etc)  PAIN CONTROL: It is helpful to take an over-the-counter pain medication regularly for the first few days/weeks.  Choose from the following that works best for you: Ibuprofen (Advil, etc) Three 200mg  tabs every 6 hours as needed. Acetaminophen  (Tylenol , etc) 500-650mg  every 6 hours as needed NOTE: You may take both of these medications together - most patients find it most helpful when alternating between the two (i.e. Ibuprofen at 6am, tylenol  at 9am, ibuprofen at 12pm ..SABRA) A  prescription for pain medication may have been prescribed for you at discharge.  Take your pain medication as prescribed.  If you are having problems/concerns with the prescription medicine, please call us  for further advice.  Avoid getting constipated.  Between the surgery and the pain medications, it is common to experience some constipation.  Increasing fluid intake (64oz of water per day) and taking a fiber supplement (such as Metamucil, Citrucel, FiberCon) 1-2 times a day regularly will usually help prevent this problem from occurring.  Take Miralax (over the counter) 1-2x/day while taking a narcotic pain medication. If no bowel movement after 48hours, you may additionally take a laxative like a bottle of Milk of Magnesia which can be purchased over the counter. Avoid enemas.   Watch out for diarrhea.  If you have many loose bowel movements,  simplify your diet to bland foods.  Stop any stool softeners and decrease your fiber supplement. If this worsens or does not improve, please call us .  Wash / shower every day.  If you were discharged with a dressing, you may remove this the day after your surgery. You may shower normally, getting soap/water on your wound, particularly after bowel movements.  Soaking in a warm bath filled a couple inches (Sitz bath) is a great way to clean the area after a bowel movement and many patients find it is a way to soothe the area.  ACTIVITIES as tolerated:   You may resume regular (light) daily activities beginning the next day--such as daily self-care, walking, climbing stairs--gradually increasing activities as tolerated.  If you can walk 30 minutes without difficulty, it is safe to try more intense activity such as jogging, treadmill, bicycling, low-impact aerobics, etc. Refrain from any heavy lifting or straining for the first 2 weeks after your procedure, particularly if your surgery was for hemorrhoids. Avoid activities that make your pain worse You may drive when you are no longer taking prescription pain medication, you can comfortably wear a seatbelt, and you can safely maneuver your car and apply brakes.  FOLLOW UP in our office Please call [facility] at [phone] to set up an appointment to see your surgeon in the office for a follow-up appointment approximately 2 weeks after your surgery. Make sure that you call for this appointment the day you arrive home to insure a convenient appointment time.  9. If you have disability or family leave forms  that need to be completed, you may have them completed by your primary care physician's office; for return to work instructions, please ask our office staff and they will be happy to assist you in obtaining this documentation   When to call us  [phone]: Poor pain control Reactions / problems with new medications (rash/itching, etc)  Fever over  101.5 F (38.5 C) Inability to urinate Nausea/vomiting Worsening swelling or bruising Continued bleeding from incision. Increased pain, redness, or drainage from the incision  The clinic staff is available to answer your questions during regular business hours (8:30am-5pm).  Please don't hesitate to call and ask to speak to one of our nurses for clinical concerns.   A surgeon from [facility] is always on call at the hospitals   If you have a medical emergency, go to the nearest emergency room or call 911.   [facility] [address], [place], [place]  [address] MAIN: [phone] FAX: [phone] [email or web address]     Post Anesthesia Home Care Instructions  Activity: Get plenty of rest for the remainder of the day. A responsible individual must stay with you for 24 hours following the procedure.  For the next 24 hours, DO NOT: -Drive a car -Advertising copywriter -Drink alcoholic beverages -Take any medication unless instructed by your physician -Make any legal decisions or sign important papers.  Meals: Start with liquid foods such as gelatin or soup. Progress to regular foods as tolerated. Avoid greasy, spicy, heavy foods. If nausea and/or vomiting occur, drink only clear liquids until the nausea and/or vomiting subsides. Call your physician if vomiting continues.  Special Instructions/Symptoms: Your throat may feel dry or sore from the anesthesia or the breathing tube placed in your throat during surgery. If this causes discomfort, gargle with warm salt water. The discomfort should disappear within 24 hours.  If you had a scopolamine patch placed behind your ear for the management of post- operative nausea and/or vomiting:  1. The medication in the patch is effective for 72 hours, after which it should be removed.  Wrap patch in a tissue and discard in the trash. Wash hands thoroughly with soap and  water. 2. You may remove the patch earlier than 72 hours if you experience unpleasant side effects which may include dry mouth, dizziness or visual disturbances. 3. Avoid touching the patch. Wash your hands with soap and water after contact with the patch.    Next dose of Tylenol  may be given at 3:30pm if needed.

## 2023-12-16 NOTE — Anesthesia Procedure Notes (Signed)
 Procedure Name: Intubation Date/Time: 12/16/2023 10:11 AM  Performed by: Casimir Lucie HERO, CRNAPre-anesthesia Checklist: Patient identified, Emergency Drugs available, Suction available and Patient being monitored Patient Re-evaluated:Patient Re-evaluated prior to induction Oxygen Delivery Method: Circle system utilized Preoxygenation: Pre-oxygenation with 100% oxygen Induction Type: IV induction Ventilation: Mask ventilation without difficulty Laryngoscope Size: Miller and 2 Grade View: Grade I Tube type: Oral Tube size: 7.5 mm Number of attempts: 1 Airway Equipment and Method: Stylet Placement Confirmation: ETT inserted through vocal cords under direct vision, positive ETCO2 and breath sounds checked- equal and bilateral Secured at: 22 cm Tube secured with: Tape Dental Injury: Teeth and Oropharynx as per pre-operative assessment

## 2023-12-16 NOTE — Op Note (Signed)
 12/16/2023  10:59 AM  PATIENT:  Christopher Duran  78 y.o. male  Patient Care Team: Arloa Elsie SAUNDERS, MD as PCP - General (Family Medicine) Jeffrie Oneil BROCKS, MD as PCP - Cardiology (Cardiology)  PRE-OPERATIVE DIAGNOSIS:  Hemorrhoids  POST-OPERATIVE DIAGNOSIS:  Internal hemorrhoids with prolapse and bleeding  PROCEDURE:   Hemorrhoidectomy left lateral; hemorrhoidectomy right anterior Anorectal exam under anesthesia  SURGEON:  Surgeon(s): Teresa Lonni HERO, MD  ASSISTANT: OR staff   ANESTHESIA:   local and general  SPECIMEN:   Hemorrhoidal tissue - left lateral Hemorrhoidal tissue - right anterior  DISPOSITION OF SPECIMEN:  PATHOLOGY  COUNTS:  Sponge, needle, and instrument counts were reported correct x2 at conclusion.  EBL: 10 mL  Drains: none  PLAN OF CARE: Discharge to home after PACU  PATIENT DISPOSITION:  PACU - hemodynamically stable.  OR FINDINGS: 2 column hemorrhoids-left lateral and right anterior.  No fissures.  No other abnormalities noted at the distal rectum or anal canal.  DESCRIPTION: The patient was seen in the pre-op holding area. The risks, benefits, complications, treatment options, and expected outcomes were previously discussed with the patient. The patient agreed with the proposed plan and has signed the informed consent form. The patient was brought to the operating room by the surgical team, identified as Christopher Duran, and the procedure verified. SCD's were applied. General anesthesia was induced without difficulty. The patient was then rolled onto the OR table in the prone jackknife position.  Pressure points were evaluated and padded.  Benzoin was applied to the buttocks and they were gently taped apart.  He was then prepped and draped in usual sterile fashion. A time out was completed and the above information confirmed and need for preoperative antibiotics.  A perianal block was then created using a dilute mixture of 0.25% Marcaine  with  epinephrine  and Exparel .  After ascertaining an appropriate level of anesthesia had been achieved, a well lubricated digital rectal exam was performed. This demonstrated no palpable abnormalities.  A Hill-Ferguson anoscope was into the anal canal and circumferential inspection demonstrated healthy appearing anoderm.  2 column internal hemorrhoids primarily in the left lateral and right anterior position.  He also has a third column was fairly diminutive in the right posterior position.  No other notable pathology within the anal canal or distal rectum on circumferential anoscopy.  Attention is directed toward the largest bundle which is in the left lateral position.  This is elevated with a DeBakey forcep with the anoscope in place.  The distal point of the hemorrhoidal tissue was incised with electrocautery.  This is then dissected free of the underlying internal sphincter muscle.  The pedicle was then ligated and divided using the hand-held LigaSure device.  The specimen is passed off.  Hemostasis is achieved with electrocautery.  The resultant hemorrhoidal defect was then closed using running 2-0 Vicryl suture after suture ligating the pedicle with a 2-0 Vicryl figure-of-eight suture.  Attention directed at the right anterior hemorrhoidal tissue. This is elevated with a DeBakey forcep with the anoscope in place.  The distal point of the hemorrhoidal tissue was incised with electrocautery.  This is then dissected free of the underlying internal sphincter muscle.  The pedicle was then ligated and divided using the hand-held LigaSure device.  The specimen is passed off.  Hemostasis is achieved with electrocautery.  The resultant hemorrhoidal defect is then closed using running 2-0 Vicryl suture after suture ligating the pedicle with a 2-0 Vicryl figure-of-eight suture.  Anal canal is irrigated.  Hemostasis is verified.  Additional local anesthetic is infiltrated at the hemorrhoidectomy sites.  All sponge,  needle, and instrument counts were reported correct.  A piece of Surgifoam was then rolled and placed in the anal canal.  The buttocks were untaped.  A dressing consisting of 4 x 4's, ABD, and mesh underwear was placed.  He was subsequently awakened from anesthesia, extubated, and transferred to a stretcher for transport to recovery in satisfactory condition.  DISPOSITION: PACU in satisfactory condition.

## 2023-12-17 ENCOUNTER — Encounter (HOSPITAL_BASED_OUTPATIENT_CLINIC_OR_DEPARTMENT_OTHER): Payer: Self-pay | Admitting: Surgery

## 2023-12-19 LAB — SURGICAL PATHOLOGY

## 2024-02-03 ENCOUNTER — Other Ambulatory Visit: Payer: Self-pay | Admitting: Urology

## 2024-02-06 ENCOUNTER — Other Ambulatory Visit (HOSPITAL_COMMUNITY): Payer: Self-pay | Admitting: Urology

## 2024-02-06 DIAGNOSIS — C61 Malignant neoplasm of prostate: Secondary | ICD-10-CM

## 2024-02-27 ENCOUNTER — Encounter (HOSPITAL_COMMUNITY): Payer: Self-pay | Admitting: Urology

## 2024-02-27 NOTE — Progress Notes (Signed)
 Spoke w/ via phone for pre-op interview--- [facility] needs dos----  BMP per anesthesia.       Lab results------ Current EKG in Epic dated 12/12/23. COVID test -----patient states asymptomatic no test needed Arrive at -------0700 NPO after MN NO Solid Food.   Pre-Surgery Ensure or G2:  Med rec completed Medications to take morning of surgery -----Norvasc  and Alfluzosin. Bring Albuterol inhaler. Diabetic medication -----  GLP1 agonist last dose: GLP1 instructions:  Patient instructed no nail polish to be worn day of surgery Patient instructed to bring photo id and insurance card day of surgery Patient aware to have Driver (ride ) / caregiver    for 24 hours after surgery - Daughter Aries Townley Patient Special Instructions ----- Fleet enema per surgeons instructions. Pre-Op special Instructions -----NS IV fluid pt has Rocephin ordered.  Patient verbalized understanding of instructions that were given at this phone interview. Patient denies chest pain, sob, fever, cough at the interview.

## 2024-03-06 ENCOUNTER — Ambulatory Visit (HOSPITAL_COMMUNITY)
Admission: RE | Admit: 2024-03-06 | Discharge: 2024-03-06 | Disposition: A | Discharge status: Home / Self Care | Attending: Urology | Admitting: Urology

## 2024-03-06 ENCOUNTER — Ambulatory Visit (HOSPITAL_COMMUNITY): Admitting: Anesthesiology

## 2024-03-06 ENCOUNTER — Ambulatory Visit (HOSPITAL_COMMUNITY)
Admission: RE | Admit: 2024-03-06 | Discharge: 2024-03-06 | Disposition: A | Source: Ambulatory Visit | Attending: Urology | Admitting: Urology

## 2024-03-06 ENCOUNTER — Encounter (HOSPITAL_COMMUNITY)
Admission: RE | Disposition: A | Discharge status: Home / Self Care | Payer: Self-pay | Source: Home / Self Care | Attending: Urology

## 2024-03-06 ENCOUNTER — Other Ambulatory Visit: Payer: Self-pay

## 2024-03-06 ENCOUNTER — Encounter (HOSPITAL_COMMUNITY): Payer: Self-pay | Admitting: Urology

## 2024-03-06 DIAGNOSIS — I10 Essential (primary) hypertension: Secondary | ICD-10-CM

## 2024-03-06 DIAGNOSIS — N419 Inflammatory disease of prostate, unspecified: Secondary | ICD-10-CM | POA: Insufficient documentation

## 2024-03-06 DIAGNOSIS — C61 Malignant neoplasm of prostate: Secondary | ICD-10-CM

## 2024-03-06 DIAGNOSIS — Z87891 Personal history of nicotine dependence: Secondary | ICD-10-CM

## 2024-03-06 HISTORY — PX: TRANSRECTAL ULTRASOUND: SHX5146

## 2024-03-06 HISTORY — PX: PROSTATE BIOPSY: SHX241

## 2024-03-06 HISTORY — DX: Malignant (primary) neoplasm, unspecified: C80.1

## 2024-03-06 LAB — BASIC METABOLIC PANEL WITH GFR
Anion gap: 9 (ref 5–15)
BUN: 17 mg/dL (ref 8–23)
CO2: 23 mmol/L (ref 22–32)
Calcium: 9.3 mg/dL (ref 8.9–10.3)
Chloride: 104 mmol/L (ref 98–111)
Creatinine, Ser: 1.28 mg/dL — ABNORMAL HIGH (ref 0.61–1.24)
GFR, Estimated: 58 mL/min — ABNORMAL LOW (ref >60)
Glucose, Bld: 102 mg/dL — ABNORMAL HIGH (ref 70–99)
Potassium: 4 mmol/L (ref 3.5–5.1)
Sodium: 136 mmol/L (ref 135–145)

## 2024-03-06 SURGERY — BIOPSY, PROSTATE, RECTAL APPROACH, WITH US GUIDANCE
Anesthesia: Monitor Anesthesia Care

## 2024-03-06 MED ORDER — FENTANYL CITRATE (PF) 100 MCG/2ML IJ SOLN
INTRAMUSCULAR | Status: DC | PRN
  Administered 2024-03-06: 25 ug via INTRAVENOUS
  Administered 2024-03-06: 50 ug via INTRAVENOUS
  Administered 2024-03-06: 25 ug via INTRAVENOUS

## 2024-03-06 MED ORDER — FLEET ENEMA RE ENEM: 1.0000 | ENEMA | Freq: Once | RECTAL | Status: DC

## 2024-03-06 MED ORDER — CHLORHEXIDINE GLUCONATE 0.12 % MT SOLN
15.0000 mL | Freq: Once | OROMUCOSAL | Status: AC
  Administered 2024-03-06: 15 mL via OROMUCOSAL

## 2024-03-06 MED ORDER — ORAL CARE MOUTH RINSE: 15.0000 mL | Freq: Once | OROMUCOSAL | Status: AC

## 2024-03-06 MED ORDER — SODIUM CHLORIDE 0.9% FLUSH: 3.0000 mL | Freq: Two times a day (BID) | INTRAVENOUS | Status: DC

## 2024-03-06 MED ORDER — LACTATED RINGERS IV SOLN: INTRAVENOUS | Status: DC

## 2024-03-06 MED ORDER — FENTANYL CITRATE (PF) 100 MCG/2ML IJ SOLN
INTRAMUSCULAR | Status: AC
  Filled 2024-03-06: qty 2

## 2024-03-06 MED ORDER — EPHEDRINE SULFATE-NACL 50-0.9 MG/10ML-% IV SOSY
PREFILLED_SYRINGE | INTRAVENOUS | Status: DC | PRN
  Administered 2024-03-06: 10 mg via INTRAVENOUS
  Administered 2024-03-06: 15 mg via INTRAVENOUS

## 2024-03-06 MED ORDER — PROPOFOL 10 MG/ML IV BOLUS
INTRAVENOUS | Status: AC
  Filled 2024-03-06: qty 20

## 2024-03-06 MED ORDER — SODIUM CHLORIDE 0.9 % IV SOLN: INTRAVENOUS | Status: DC

## 2024-03-06 MED ORDER — GENTAMICIN SULFATE 40 MG/ML IJ SOLN
400.0000 mg | INTRAVENOUS | Status: AC
  Administered 2024-03-06: 400 mg via INTRAVENOUS
  Filled 2024-03-06: qty 10

## 2024-03-06 MED ORDER — LIDOCAINE 2% (20 MG/ML) 5 ML SYRINGE
INTRAMUSCULAR | Status: DC | PRN
  Administered 2024-03-06: 40 mg via INTRAVENOUS

## 2024-03-06 MED ORDER — SODIUM CHLORIDE 0.9 % IV SOLN
2.0000 g | INTRAVENOUS | Status: AC
  Administered 2024-03-06: 2 g via INTRAVENOUS
  Filled 2024-03-06: qty 20

## 2024-03-06 MED ORDER — ONDANSETRON HCL 4 MG/2ML IJ SOLN
INTRAMUSCULAR | Status: DC | PRN
  Administered 2024-03-06: 4 mg via INTRAVENOUS

## 2024-03-06 MED ORDER — PROPOFOL 10 MG/ML IV BOLUS
INTRAVENOUS | Status: DC | PRN
  Administered 2024-03-06 (×2): 30 mg via INTRAVENOUS

## 2024-03-06 MED ORDER — LIDOCAINE HCL 2 % IJ SOLN
INTRAMUSCULAR | Status: DC | PRN
  Administered 2024-03-06: 10 mL

## 2024-03-06 MED ORDER — PROPOFOL 500 MG/50ML IV EMUL
INTRAVENOUS | Status: DC | PRN
  Administered 2024-03-06: 70 ug/kg/min via INTRAVENOUS

## 2024-03-06 MED ORDER — DEXAMETHASONE SOD PHOSPHATE PF 10 MG/ML IJ SOLN
INTRAMUSCULAR | Status: DC | PRN
  Administered 2024-03-06: 5 mg via INTRAVENOUS

## 2024-03-06 SURGICAL SUPPLY — 11 items
GLOVE SURG SS PI 8.0 STRL IVOR (GLOVE) IMPLANT
INST BIOPSY MAXCORE 18GX25 (NEEDLE) IMPLANT
INSTR BIOPSY MAXCORE 18GX20 (NEEDLE) IMPLANT
KIT TURNOVER CYSTO (KITS) ×1 IMPLANT
NDL SAFETY ECLIPSE 18X1.5 (NEEDLE) IMPLANT
NDL SPNL 22GX7 QUINCKE BK (NEEDLE) IMPLANT
SLEEVE SCD COMPRESS KNEE MED (STOCKING) ×1 IMPLANT
SURGILUBE 2OZ TUBE FLIPTOP (MISCELLANEOUS) ×1 IMPLANT
SYR CONTROL 10ML LL (SYRINGE) IMPLANT
TOWEL OR 17X24 6PK STRL BLUE (TOWEL DISPOSABLE) ×1 IMPLANT
UNDERPAD 30X36 HEAVY ABSORB (UNDERPADS AND DIAPERS) ×1 IMPLANT

## 2024-03-06 NOTE — Anesthesia Postprocedure Evaluation (Signed)
 Anesthesia Post Note  Patient: Christopher Duran  Procedure(s) Performed: BIOPSY, PROSTATE, RECTAL APPROACH, WITH US  GUIDANCE ULTRASOUND, RECTAL APPROACH     Patient location during evaluation: PACU Anesthesia Type: MAC Level of consciousness: awake and alert Pain management: pain level controlled Vital Signs Assessment: post-procedure vital signs reviewed and stable Respiratory status: spontaneous breathing, nonlabored ventilation, respiratory function stable and patient connected to nasal cannula oxygen Cardiovascular status: blood pressure returned to baseline and stable Postop Assessment: no apparent nausea or vomiting Anesthetic complications: no   There were no known notable events for this encounter.  Last Vitals:  Vitals:   03/06/24 1030 03/06/24 1056  BP: (!) 143/63   Pulse: 68 62  Resp: 18 13  Temp:  37.1 C  SpO2: 96% 98%    Last Pain:  Vitals:   03/06/24 0707  TempSrc: Oral  PainSc: 0-No pain                 Christopher Duran

## 2024-03-06 NOTE — Transfer of Care (Signed)
 Immediate Anesthesia Transfer of Care Note  Patient: Christopher Duran  Procedure(s) Performed: BIOPSY, PROSTATE, RECTAL APPROACH, WITH US  GUIDANCE ULTRASOUND, RECTAL APPROACH  Patient Location: PACU  Anesthesia Type:MAC  Level of Consciousness: awake  Airway & Oxygen Therapy: Patient Spontanous Breathing  Post-op Assessment: Report given to RN  Post vital signs: stable  Last Vitals:  Vitals Value Taken Time  BP 137/58 03/06/24 10:00  Temp 36.5 C 03/06/24 09:59  Pulse 69 03/06/24 10:01  Resp 17 03/06/24 10:01  SpO2 98 % 03/06/24 10:01  Vitals shown include unfiled device data.  Last Pain:  Vitals:   03/06/24 0707  TempSrc: Oral  PainSc: 0-No pain      Patients Stated Pain Goal: 5 (03/06/24 9292)  Complications: There were no known notable events for this encounter.

## 2024-03-06 NOTE — Op Note (Signed)
 Procedure: 1.  Trans rectal ultrasound of the prostate.  2 ultrasound-guided needle biopsy of the prostate.  Pre-op diagnosis: Prostate cancer.  Postop diagnosis: Same.  Surgeon: Dr. Norleen Seltzer.  Anesthesia: MAC and local.  Drains: None.  Specimens: 12 core pattern prostate biopsy and 2 additional cores from the left lateral apical ROI.  EBL: None.  Complications: None.  Indications: The patient is a 78 year old male with a history of a T1c grade group 2 prostate cancer on surveillance who is due for surveillance biopsy.  He had a small PI-RADS 4 lesion at the left lateral apex in the peripheral zone but because of hemorrhoid issues he was unable to tolerate a biopsy in the office so he is to undergo cognitive fusion and standard template needle biopsy today.  Procedure: He was given Rocephin and gentamicin for preoperative antibiotic coverage.  He was taken the operating room was placed in the right lateral decubitus position.  Sedation was given as needed.  The transrectal ultrasound probe was assembled and inserted and scanning was performed.  The prostate ultrasound demonstrated a 74.48 mL prostate with a large transitional zone with normal echotexture and a few calcifications.  The peripheral zone was isoechoic with exception of a small hypoechoic lesion at the left lateral apex in the area of the ROI on MRI.  The seminal vesicles were unremarkable.  After completion of the prostate ultrasound a 12 core template biopsy was obtained with cores from the medial and lateral base, mid and apical prostate on each side and once those had been obtained 2 additional cores from the hypoechoic area in the left lateral apex it was felt to be consistent with the ROI from the MRI.  Once all biopsies had been obtained, the probe was removed.  There was minimal bleeding.  He was then returned to the recovery room in stable condition.

## 2024-03-06 NOTE — Anesthesia Preprocedure Evaluation (Addendum)
 Anesthesia Evaluation  Patient identified by MRN, date of birth, ID band Patient awake    Reviewed: Allergy & Precautions, NPO status , Patient's Chart, lab work & pertinent test results, reviewed documented beta blocker date and time   History of Anesthesia Complications (+) PONV and history of anesthetic complications  Airway Mallampati: II  TM Distance: >3 FB     Dental no notable dental hx.    Pulmonary shortness of breath and with exertion, neg sleep apnea, neg COPD, neg recent URI, former smoker   breath sounds clear to auscultation       Cardiovascular hypertension, (-) CAD, (-) Past MI and (-) Cardiac Stents  Rhythm:Regular Rate:Normal     Neuro/Psych neg Seizures    GI/Hepatic ,neg GERD  ,,(+) neg Cirrhosis        Endo/Other    Renal/GU Renal disease     Musculoskeletal   Abdominal   Peds  Hematology  (+) Blood dyscrasia, anemia   Anesthesia Other Findings   Reproductive/Obstetrics                              Anesthesia Physical Anesthesia Plan  ASA: 2  Anesthesia Plan: MAC   Post-op Pain Management:    Induction: Intravenous  PONV Risk Score and Plan: 2 and Ondansetron , Propofol  infusion and TIVA  Airway Management Planned: Natural Airway and Simple Face Mask  Additional Equipment:   Intra-op Plan:   Post-operative Plan: Extubation in OR  Informed Consent: I have reviewed the patients History and Physical, chart, labs and discussed the procedure including the risks, benefits and alternatives for the proposed anesthesia with the patient or authorized representative who has indicated his/her understanding and acceptance.     Dental advisory given  Plan Discussed with: CRNA  Anesthesia Plan Comments:          Anesthesia Quick Evaluation

## 2024-03-06 NOTE — H&P (Signed)
 Subjective:     1. T1c GG2 prostate cancer on biopsy in 4/20 and remains on surveillance with a low risk MRI in 2/24.  2. 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  3. ED on sildenafil.  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  5. History of stones.  03/06/24: Christopher Duran returns today in f/u for a prostate biopsy for surveillance of his T1c GG2 prostate cancer.  He has no new complaints.   01/30/24: Christopher Duran returns today in f/u. The surveillance biopsy was aborted on 10/17/23 because of hemorrhoids and a fissure. His PSA continues to rise and is up to 6.1.  10/17/23: Christopher Duran returns today for a surveillance biopsy. The MRI showed an 89ml prostate with a small P4 lesion at the left apex. He is having issues with his hemorrhoids today.  08/22/23: Christopher Duran returns today in f/u. His PSA is 5.76 which remains below the prior high. His IPSS 16 with nocturia x 3 and a reduced stream. He is content with the alfuzosin . He continues to respond to sildenafil. He has not had to use Cipro  in the last month. He has had no flank pain or hematuria.  12/22/22: Christopher Duran returns today in f/u. 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 His PSA is back down is 5.47 prior to this visit. He remains on alfuzosin  and sildenafil for BPH with BOO and ED. His IPSS is 15 which is stable. He has nocturia x 3. He continues to use the Cipro  prn for CPPS with the last use about 3 weeks ago. He has a history of stones but has had no flank pain or hematuria.  06/23/22: Christopher Duran returns today in f/u for the history below. His PSA is 6.44 which is up from 4.75. He was having some increased voiding symptoms at the time of the blood draw. He had and MRIP on 05/21/22 that showed a 72ml prostate with PIRADS 1 & 2 lesions. He remains on alfiuzosin with an IPSS of 17 which is stable. He remains on sildenafil. He last took Cipro  about 2 weeks ago for a prostatitis flair. He has had no flank pain or  hematuria.  10/26/21: Christopher Duran returns today in f/u for his history of chronic prostatitis, BPH with BOO, prostate cancer on surveillance, ED and stones. His PSA is 4.75 which is minimally changed. KUB today shows no stones. His UA is clear. His last biopsy was an MR fusion biopsy in 1/21. He has been on the Cipro  recently because of prostatitis flairs. He has some urgency and frequency. He continues to respond to sildenafil. He remains on alfuzosin .  04/29/21; Christopher Duran returns today in f/u. He took Cipro  in December for a prostatitis flair. He remains on alfuzosin  for BPH with BOO. His IPSS is stable at 17. His PSA is back down to 4.43 after rising to 5.64. He has T1c GG2 prostate cancer on biopsy in 4/20 and remains on surveillance. He remains on sildenafil for ED and that continues to work. He has a history of stones and feels like he may have passed one since October but his KUB in 10/22 was unremarkable. His UA is clear today.  01/21/21: Christopher Duran returns today in f/u. He had ESWL back in June. The left UVJ stone was not present on the last KUB but he is having some increased urgency and some low back pain. He has had no hematuria. He has to strain to void and has  intermittency. His PSA is 5.64 which is up from 3.45. He has small volume Gleason 7(3+4) disease on a biopsy from 4/20 and had confirmation with MR fusion biopsy in 1/21. A surveillance biopsy in 1/21 showed no progressive and negative ROI's. He has been taking cipro  intermittently. He had RSV about a month ago. He was febrile and had URI symptoms with that.   Gu Hx: Mr. Isenberg has a history of prostate cancer, BPH with BOO, ED, interstitial cystitis and CPPS who was noted to have a rising PSA up to 3.68 prompting a TRUS biopsy of the prostate on 07/24/18. This demonstrated Gleason 3+4=7 adenocarcinoma with 2 out of 12 biopsy cores positive for malignancy.  He had an MRI fusion biopsy in 1/21 for active surveillance of low volume Gleason 7 prostate cancer.  The MRI showed PIRAD3 ROI's in the left lateral apex and right mid medial prostate. ROI's were negative for cancer but he had 1 core with low volume Gleason 7(3+4) and one core with low volume Gleason 6 both on the right side. His PSA is down slightly at 3.66 prior to this visit consistent with non-progression of his prostate cancer. ROS:  Review of Systems  All other systems reviewed and are negative.   Allergies  Allergen Reactions   Penicillins Itching and Nausea Only   Tetracyclines & Related Itching and Nausea And Vomiting   Zithromax [Azithromycin] Itching    Past Medical History:  Diagnosis Date   BPH (benign prostatic hyperplasia)    Cancer (HCC)    Prostate   Diarrhea    Hypertension    IBS (irritable bowel syndrome)    MVP (mitral valve prolapse)    PONV (postoperative nausea and vomiting)     Past Surgical History:  Procedure Laterality Date   artho     shoulder b/l   EXTRACORPOREAL SHOCK WAVE LITHOTRIPSY Left 09/04/2020   Procedure: LEFT EXTRACORPOREAL SHOCK WAVE LITHOTRIPSY (ESWL);  Surgeon: Gaston Hamilton, MD;  Location: [facility];  Service: Urology;  Laterality: Left;   HEMORRHOID SURGERY N/A 12/16/2023   Procedure: HEMORRHOIDECTOMY TIMES TWO;  Surgeon: Teresa Lonni HERO, MD;  Location: [facility];  Service: General;  Laterality: N/A;   RECTAL EXAM UNDER ANESTHESIA N/A 12/16/2023   Procedure: EXAM UNDER ANESTHESIA, RECTUM;  Surgeon: Teresa Lonni HERO, MD;  Location: [facility];  Service: General;  Laterality: N/A;   SHOULDER ARTHROSCOPY Bilateral     Social History   Socioeconomic History   Marital status: Divorced    Spouse name: Not on file   Number of children: Not on file   Years of education: Not on file   Highest education level: Not on file  Occupational History   Not on file  Tobacco Use   Smoking status: Former    Current packs/day: 0.00    Average packs/day: 1 pack/day for 4.0 years (4.0  ttl pk-yrs)    Types: Cigarettes    Start date: 12/14/1967    Quit date: 12/14/1971    Years since quitting: 52.2   Smokeless tobacco: Never   Tobacco comments:    4 years only  Vaping Use   Vaping status: Never Used  Substance and Sexual Activity   Alcohol use: No    Alcohol/week: 0.0 standard drinks of alcohol   Drug use: No   Sexual activity: Not on file  Other Topics Concern   Not on file  Social History Narrative   Not on file   Social Drivers  of Health   Financial Resource Strain: Low Risk  (02/09/2018)   Overall Financial Resource Strain (CARDIA)    Difficulty of Paying Living Expenses: Not hard at all  Food Insecurity: Unknown (02/08/2018)   Hunger Vital Sign    Worried About Running Out of Food in the Last Year: Patient declined    Ran Out of Food in the Last Year: Patient declined  Transportation Needs: Unknown (02/08/2018)   PRAPARE - Administrator, Civil Service (Medical): Patient declined    Lack of Transportation (Non-Medical): Patient declined  Physical Activity: Insufficiently Active (02/08/2018)   Exercise Vital Sign    Days of Exercise per Week: 2 days    Minutes of Exercise per Session: 60 min  Stress: No Stress Concern Present (02/08/2018)   [employer] of Occupational Health - Occupational Stress Questionnaire    Feeling of Stress : Not at all  Social Connections: Moderately Integrated (02/09/2018)   Social Connection and Isolation Panel    Frequency of Communication with Friends and Family: Three times a week    Frequency of Social Gatherings with Friends and Family: Three times a week    Attends Religious Services: More than 4 times per year    Active Member of Clubs or Organizations: Yes    Attends Banker Meetings: 1 to 4 times per year    Marital Status: Divorced  Intimate Partner Violence: Unknown (02/09/2018)   Humiliation, Afraid, Rape, and Kick questionnaire    Fear of Current or Ex-Partner: Patient declined     Emotionally Abused: Patient declined    Physically Abused: Patient declined    Sexually Abused: Patient declined    Family History  Problem Relation Age of Onset   Cancer Mother    CVA Father     Anti-infectives: Anti-infectives (From admission, onward)    Start     Dose/Rate Route Frequency Ordered Stop   03/06/24 0715  gentamicin  (GARAMYCIN ) 400 mg in dextrose 5 % 100 mL IVPB        400 mg 110 mL/hr over 60 Minutes Intravenous To Short Stay 03/06/24 0704 03/07/24 0715   03/06/24 0704  cefTRIAXone  (ROCEPHIN ) 2 g in sodium chloride  0.9 % 100 mL IVPB        2 g 200 mL/hr over 30 Minutes Intravenous 30 min pre-op 03/06/24 9295         Current Facility-Administered Medications  Medication Dose Route Frequency Provider Last Rate Last Admin   0.9 %  sodium chloride  infusion   Intravenous Continuous Watt Rush, MD 40 mL/hr at 03/06/24 0733 New Bag at 03/06/24 0733   cefTRIAXone  (ROCEPHIN ) 2 g in sodium chloride  0.9 % 100 mL IVPB  2 g Intravenous 30 min Pre-Op Watt Rush, MD       gentamicin  (GARAMYCIN ) 400 mg in dextrose 5 % 100 mL IVPB  400 mg Intravenous To [facility] Jackalyn Haith, MD       lactated ringers  infusion   Intravenous Continuous Corinne Garnette BRAVO, MD       Home meds: alfuzosin  10 mg Oral - tablet, extended release 24 hr ciprofloxacin  HCl 500 mg Oral - tablet ramipril  10 mg Oral - capsule sildenafil 100 mg Oral - tablet Hydrochlorothiazide 12.5 MG  Objective: Vital signs in last 24 hours: BP (!) 154/67   Pulse 67   Temp 98.4 F (36.9 C) (Oral)   Resp 17   Ht 5' 11 (1.803 m)   Wt 90.7 kg   SpO2 98%  BMI 27.89 kg/m   Intake/Output from previous day: No intake/output data recorded. Intake/Output this shift: No intake/output data recorded.   Physical Exam Vitals reviewed.  Constitutional:      Appearance: Normal appearance.  Cardiovascular:     Rate and Rhythm: Normal rate and regular rhythm.  Pulmonary:     Effort: Pulmonary effort is normal. No  respiratory distress.  Neurological:     Mental Status: He is alert.     Lab Results:  No results found for this or any previous visit (from the past 24 hours).  BMET No results for input(s): NA, K, CL, CO2, GLUCOSE, BUN, CREATININE, CALCIUM in the last 72 hours. PT/INR No results for input(s): LABPROT, INR in the last 72 hours. ABG No results for input(s): PHART, HCO3 in the last 72 hours.  Invalid input(s): PCO2, PO2  Studies/Results: No results found.   Assessment/Plan: T1c GG2 prostate cancer.   TRUS surveillance biopsy today.  He takes Cipro  intermittently for chronic prostatitis and will be covered with rocephin  and gentamicin .   Danea Manter 03/06/2024

## 2024-03-06 NOTE — Discharge Instructions (Signed)

## 2024-03-07 ENCOUNTER — Encounter (HOSPITAL_COMMUNITY): Payer: Self-pay | Admitting: Urology

## 2024-03-07 LAB — SURGICAL PATHOLOGY
